# Patient Record
Sex: Male | Born: 1945 | Race: White | Hispanic: No | Marital: Married | Smoking: Never smoker
Health system: Southern US, Community
[De-identification: ages and names within clinical notes are randomized; demographics above are authoritative.]

## PROBLEM LIST (undated history)

## (undated) DIAGNOSIS — R351 Nocturia: Secondary | ICD-10-CM

## (undated) DIAGNOSIS — R3915 Urgency of urination: Secondary | ICD-10-CM

## (undated) DIAGNOSIS — N201 Calculus of ureter: Secondary | ICD-10-CM

## (undated) DIAGNOSIS — E7800 Pure hypercholesterolemia, unspecified: Secondary | ICD-10-CM

## (undated) DIAGNOSIS — K200 Eosinophilic esophagitis: Secondary | ICD-10-CM

## (undated) DIAGNOSIS — R319 Hematuria, unspecified: Secondary | ICD-10-CM

## (undated) DIAGNOSIS — M545 Low back pain, unspecified: Secondary | ICD-10-CM

## (undated) DIAGNOSIS — G4733 Obstructive sleep apnea (adult) (pediatric): Secondary | ICD-10-CM

## (undated) DIAGNOSIS — Z87442 Personal history of urinary calculi: Secondary | ICD-10-CM

## (undated) DIAGNOSIS — N200 Calculus of kidney: Secondary | ICD-10-CM

## (undated) DIAGNOSIS — G4730 Sleep apnea, unspecified: Secondary | ICD-10-CM

## (undated) DIAGNOSIS — R413 Other amnesia: Secondary | ICD-10-CM

## (undated) DIAGNOSIS — K219 Gastro-esophageal reflux disease without esophagitis: Secondary | ICD-10-CM

## (undated) DIAGNOSIS — E785 Hyperlipidemia, unspecified: Secondary | ICD-10-CM

## (undated) DIAGNOSIS — Z8719 Personal history of other diseases of the digestive system: Secondary | ICD-10-CM

## (undated) HISTORY — PX: EXTRACORPOREAL SHOCK WAVE LITHOTRIPSY: SHX1557

## (undated) HISTORY — DX: Low back pain, unspecified: M54.50

## (undated) HISTORY — PX: ESOPHAGOGASTRODUODENOSCOPY (EGD) WITH ESOPHAGEAL DILATION: SHX5812

## (undated) HISTORY — PX: CYSTOSCOPY WITH STENT PLACEMENT: SHX5790

## (undated) HISTORY — PX: FOOT SURGERY: SHX648

## (undated) HISTORY — PX: TONSILLECTOMY AND ADENOIDECTOMY: SUR1326

## (undated) HISTORY — DX: Hematuria, unspecified: R31.9

## (undated) HISTORY — PX: TONSILLECTOMY: SUR1361

## (undated) HISTORY — PX: HEMORRHOID SURGERY: SHX153

## (undated) HISTORY — PX: ADENOIDECTOMY: SUR15

## (undated) HISTORY — DX: Eosinophilic esophagitis: K20.0

## (undated) HISTORY — DX: Pure hypercholesterolemia, unspecified: E78.00

---
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
## 2008-07-31 HISTORY — PX: CATARACT EXTRACTION W/ INTRAOCULAR LENS  IMPLANT, BILATERAL: SHX1307

## 2010-12-30 HISTORY — PX: CYSTOSCOPY WITH STENT PLACEMENT: SHX5790

## 2011-02-17 ENCOUNTER — Ambulatory Visit (HOSPITAL_COMMUNITY)
Admission: RE | Admit: 2011-02-17 | Discharge: 2011-02-17 | Disposition: A | Discharge status: Home / Self Care | Payer: Medicare Other | Source: Ambulatory Visit | Attending: Urology | Admitting: Urology

## 2011-02-17 DIAGNOSIS — M79609 Pain in unspecified limb: Secondary | ICD-10-CM | POA: Insufficient documentation

## 2011-02-17 DIAGNOSIS — E785 Hyperlipidemia, unspecified: Secondary | ICD-10-CM | POA: Insufficient documentation

## 2011-02-27 ENCOUNTER — Ambulatory Visit (HOSPITAL_COMMUNITY)
Admission: RE | Admit: 2011-02-27 | Discharge: 2011-02-27 | Disposition: A | Discharge status: Home / Self Care | Payer: Medicare Other | Source: Ambulatory Visit | Attending: Urology | Admitting: Urology

## 2011-02-27 DIAGNOSIS — E669 Obesity, unspecified: Secondary | ICD-10-CM | POA: Insufficient documentation

## 2011-02-27 DIAGNOSIS — N201 Calculus of ureter: Secondary | ICD-10-CM | POA: Insufficient documentation

## 2011-02-27 DIAGNOSIS — E7800 Pure hypercholesterolemia, unspecified: Secondary | ICD-10-CM | POA: Insufficient documentation

## 2011-02-27 DIAGNOSIS — G4730 Sleep apnea, unspecified: Secondary | ICD-10-CM | POA: Insufficient documentation

## 2011-03-03 ENCOUNTER — Ambulatory Visit (HOSPITAL_BASED_OUTPATIENT_CLINIC_OR_DEPARTMENT_OTHER)
Admission: RE | Admit: 2011-03-03 | Discharge: 2011-03-03 | Disposition: A | Discharge status: Home / Self Care | Payer: Medicare Other | Source: Ambulatory Visit | Attending: Urology | Admitting: Urology

## 2011-03-03 DIAGNOSIS — Z01810 Encounter for preprocedural cardiovascular examination: Secondary | ICD-10-CM | POA: Insufficient documentation

## 2011-03-03 DIAGNOSIS — Z01812 Encounter for preprocedural laboratory examination: Secondary | ICD-10-CM | POA: Insufficient documentation

## 2011-03-03 DIAGNOSIS — Z466 Encounter for fitting and adjustment of urinary device: Secondary | ICD-10-CM | POA: Insufficient documentation

## 2011-03-03 DIAGNOSIS — G4733 Obstructive sleep apnea (adult) (pediatric): Secondary | ICD-10-CM | POA: Insufficient documentation

## 2011-03-17 NOTE — Op Note (Signed)
  NAMEELIGH, RYBACKI NO.:  [id number]  MEDICAL RECORD NO.:  [id number]  LOCATION:                                 FACILITY:  PHYSICIAN:  Danae Chen, M.D.  DATE OF BIRTH:  06-12-1946  DATE OF PROCEDURE:  03/03/2011 DATE OF DISCHARGE:                              OPERATIVE REPORT   PREOPERATIVE DIAGNOSES:  Status post extracorporeal shockwave lithotripsy left ureteral calculus and double-J stent.  POSTOPERATIVE DIAGNOSES:  Status post extracorporeal shockwave lithotripsy left ureteral calculus and double-J stent.  PROCEDURE:  Cystoscopy and removal of double-J stent.  SURGEON:  Danae Chen, MD  ANESTHESIA:  Monitored anesthesia care.  INDICATIONS:  The patient is a 65 year old male who had ESL of the left ureteral calculus on July 30th.  He had a double-J stent inserted at the [facility] in [place] on June 20th for severe left flank pain.  The patient could not tolerate cystoscopy in the office under local anesthesia.  He is scheduled today for removal of double-J stent under anesthesia.  The patient was identified by his wristband and proper time-out was taken.  Under monitored anesthesia care, he was prepped and draped and placed in the dorsal lithotomy position.  A panendoscope was inserted in the bladder.  The anterior urethra is normal.  He has moderate prostatic hypertrophy.  The bladder mucosa was reddened.  There was no stone or tumor in the bladder.  The distal end of the double-J stent was grasped with a grasping forceps and pulled out of the urethra.  The patient tolerated the procedure well and left the OR in satisfactory condition to post-anesthesia care unit.     Danae Chen, M.D.     MN/MEDQ  D:  03/03/2011  T:  03/04/2011  Job:  [id number]  Electronically Signed by Lindaann Slough M.D. on 03/17/2011 10:39:32 AM

## 2014-06-16 ENCOUNTER — Other Ambulatory Visit: Payer: Self-pay | Admitting: Urology

## 2014-06-19 ENCOUNTER — Encounter (HOSPITAL_COMMUNITY): Payer: Self-pay | Admitting: *Deleted

## 2014-06-29 ENCOUNTER — Ambulatory Visit (HOSPITAL_COMMUNITY)
Admission: RE | Admit: 2014-06-29 | Discharge: 2014-06-29 | Disposition: A | Discharge status: Home / Self Care | Payer: Medicare Other | Source: Ambulatory Visit | Attending: Urology | Admitting: Urology

## 2014-06-29 ENCOUNTER — Ambulatory Visit (HOSPITAL_COMMUNITY): Payer: Medicare Other

## 2014-06-29 ENCOUNTER — Encounter (HOSPITAL_COMMUNITY): Payer: Self-pay | Admitting: General Practice

## 2014-06-29 ENCOUNTER — Encounter (HOSPITAL_COMMUNITY)
Admission: RE | Disposition: A | Discharge status: Home / Self Care | Payer: Self-pay | Source: Ambulatory Visit | Attending: Urology

## 2014-06-29 DIAGNOSIS — Z87442 Personal history of urinary calculi: Secondary | ICD-10-CM | POA: Diagnosis not present

## 2014-06-29 DIAGNOSIS — Z8639 Personal history of other endocrine, nutritional and metabolic disease: Secondary | ICD-10-CM | POA: Insufficient documentation

## 2014-06-29 DIAGNOSIS — Z8709 Personal history of other diseases of the respiratory system: Secondary | ICD-10-CM | POA: Diagnosis not present

## 2014-06-29 DIAGNOSIS — Z8041 Family history of malignant neoplasm of ovary: Secondary | ICD-10-CM | POA: Insufficient documentation

## 2014-06-29 DIAGNOSIS — Z8042 Family history of malignant neoplasm of prostate: Secondary | ICD-10-CM | POA: Insufficient documentation

## 2014-06-29 DIAGNOSIS — N200 Calculus of kidney: Secondary | ICD-10-CM | POA: Insufficient documentation

## 2014-06-29 DIAGNOSIS — Z818 Family history of other mental and behavioral disorders: Secondary | ICD-10-CM | POA: Insufficient documentation

## 2014-06-29 DIAGNOSIS — Z833 Family history of diabetes mellitus: Secondary | ICD-10-CM | POA: Insufficient documentation

## 2014-06-29 HISTORY — DX: Sleep apnea, unspecified: G47.30

## 2014-06-29 HISTORY — DX: Personal history of urinary calculi: Z87.442

## 2014-06-29 SURGERY — LITHOTRIPSY, ESWL
Anesthesia: LOCAL | Laterality: Right

## 2014-06-29 MED ORDER — DIAZEPAM 5 MG PO TABS
10.0000 mg | ORAL_TABLET | ORAL | Status: AC
  Administered 2014-06-29: 10 mg via ORAL
  Filled 2014-06-29: qty 2

## 2014-06-29 MED ORDER — CIPROFLOXACIN HCL 500 MG PO TABS
500.0000 mg | ORAL_TABLET | ORAL | Status: AC
  Administered 2014-06-29: 500 mg via ORAL
  Filled 2014-06-29: qty 1

## 2014-06-29 MED ORDER — DEXTROSE-NACL 5-0.45 % IV SOLN
INTRAVENOUS | Status: DC
  Administered 2014-06-29: 11:00:00 via INTRAVENOUS

## 2014-06-29 MED ORDER — ONDANSETRON HCL 4 MG/2ML IJ SOLN
4.0000 mg | Freq: Once | INTRAMUSCULAR | Status: AC
  Administered 2014-06-29: 4 mg via INTRAVENOUS
  Filled 2014-06-29: qty 2

## 2014-06-29 MED ORDER — DIPHENHYDRAMINE HCL 25 MG PO CAPS
25.0000 mg | ORAL_CAPSULE | ORAL | Status: AC
  Administered 2014-06-29: 25 mg via ORAL
  Filled 2014-06-29: qty 1

## 2014-06-29 NOTE — H&P (Signed)
History of Present Illness    Mr Slinker returns for follow-up management of nephrolithiasis. He has 2 adjacent stones in the mid pole of the right kidney. The largest one measures 8 x 6 mm, the smaller one measures 5 x 4 mm. KUB shows no changes in the size or location of the stones. He does not have any flank pain. Urinalysis shows 0-2 RBCs.   Past Medical History Problems  1. History of hypercholesterolemia (Z86.39) 2. History of kidney stones (Z87.442) 3. History of sleep apnea (Z87.09)  Surgical History Problems  1. History of Cystoscopy With Insertion Of Ureteral Stent 2. History of Cystoscopy With Removal Of Object 3. History of Hemorrhoidectomy 4. History of Lithotripsy  Current Meds 1. Aspirin 81 MG Oral Tablet;  Therapy: (Recorded:19Jul2012) to Recorded 2. Gabapentin 300 MG Oral Capsule;  Therapy: (Recorded:14Oct2015) to Recorded 3. Ibuprofen TABS;  Therapy: (Recorded:14Oct2015) to Recorded 4. Multi-Vitamin TABS;  Therapy: (Recorded:19Jul2012) to Recorded 5. Tamsulosin HCl - 0.4 MG Oral Capsule;  Therapy: (Recorded:19Jul2012) to Recorded 6. TiZANidine HCl - 4 MG Oral Tablet;  Therapy: (Recorded:14Oct2015) to Recorded  Allergies Medication  1. No Known Drug Allergies  Family History Problems  1. Family history of Diabetes Mellitus : Father 2. Family history of Family Health Status Number Of Children   1 son/ 1 daughter 3. Family history of Father Deceased At Age 45   Prostate cancer 24. Family history of Mother Deceased At Age 41   Ovarian 5. Family history of Prostate Cancer  Social History Problems  1. Denied: History of Alcohol Use 2. Caffeine Use   3 per  wk 3. Marital History - Currently Married 4. Never A Smoker  Review of Systems Genitourinary, constitutional, skin, eye, otolaryngeal, hematologic/lymphatic, cardiovascular, pulmonary, endocrine, musculoskeletal, gastrointestinal, neurological and psychiatric system(s) were reviewed and pertinent  findings if present are noted and are otherwise negative.    Physical Exam Constitutional: Well nourished and well developed . No acute distress.  ENT:. The ears and nose are normal in appearance.  Neck: The appearance of the neck is normal and no neck mass is present.  Pulmonary: No respiratory distress and normal respiratory rhythm and effort.  Cardiovascular: Heart rate and rhythm are normal . No peripheral edema.  Abdomen: The abdomen is soft and nontender. No masses are palpated. No CVA tenderness. No hernias are palpable. No hepatosplenomegaly noted.  Genitourinary: Examination of the penis demonstrates no discharge, no masses, no lesions and a normal meatus. The scrotum is without lesions. The right epididymis is palpably normal and non-tender. The left epididymis is palpably normal and non-tender. The right testis is non-tender and without masses. The left testis is non-tender and without masses.  Lymphatics: The femoral and inguinal nodes are not enlarged or tender.  Skin: Normal skin turgor, no visible rash and no visible skin lesions.  Neuro/Psych:. Mood and affect are appropriate.    Results/Data Urine [Data Includes: Last 1 Day]   [id number]  COLOR AMBER   APPEARANCE CLEAR   SPECIFIC GRAVITY 1.030   pH 5.0   GLUCOSE NEG mg/dL  BILIRUBIN SMALL   KETONE NEG mg/dL  BLOOD SMALL   PROTEIN NEG mg/dL  UROBILINOGEN 0.2 mg/dL  NITRITE NEG   LEUKOCYTE ESTERASE NEG   SQUAMOUS EPITHELIAL/HPF RARE   WBC 3-6 WBC/hpf  RBC 0-2 RBC/hpf  BACTERIA FEW   CRYSTALS NONE SEEN   CASTS NONE SEEN   Other SEE NOTE    Assessment Assessed  1. Calculus of right kidney (N20.0)  Plan Calculus  of right kidney  1. Follow-up Schedule Surgery Office  Follow-up  Status: Complete  Done: [id number] Health Maintenance  2. UA With REFLEX; [Do Not Release]; Status:Complete;   Done: [id number] 10:39AM  Urinalysis today shows 0-2 RBCs. Will not get CT scan. I have discussed management of the renal  calculi with him. I told him that I think ESL is the least invasive of the procedures. He had ESL of a left ureteral stone in the past. He wishes to proceed with ESL.

## 2014-06-29 NOTE — Op Note (Signed)
Refer to [facility] Op Note scanned in the chart 

## 2014-06-29 NOTE — Discharge Instructions (Signed)
See [employer] Discharge papers.

## 2014-07-23 NOTE — Op Note (Signed)
Refer to [facility] Op Note scanned in the chart 

## 2015-09-29 DIAGNOSIS — K200 Eosinophilic esophagitis: Secondary | ICD-10-CM

## 2015-09-29 HISTORY — DX: Eosinophilic esophagitis: K20.0

## 2015-11-04 ENCOUNTER — Encounter: Payer: Self-pay | Admitting: Allergy and Immunology

## 2015-11-04 ENCOUNTER — Ambulatory Visit (INDEPENDENT_AMBULATORY_CARE_PROVIDER_SITE_OTHER): Payer: Medicare Other | Admitting: Allergy and Immunology

## 2015-11-04 VITALS — BP 130/82 | HR 76 | Temp 98.5°F | Resp 18 | Ht 62.99 in | Wt 218.7 lb

## 2015-11-04 DIAGNOSIS — Z91018 Allergy to other foods: Secondary | ICD-10-CM

## 2015-11-04 DIAGNOSIS — K200 Eosinophilic esophagitis: Secondary | ICD-10-CM | POA: Diagnosis not present

## 2015-11-04 MED ORDER — FLUTICASONE PROPIONATE HFA 220 MCG/ACT IN AERO: INHALATION_SPRAY | RESPIRATORY_TRACT | Status: DC

## 2015-11-04 NOTE — Progress Notes (Signed)
Dear Dr Lin Landsman,  Thank you for referring Robert Oconnell. to the [facility] on 11/04/2015.   Below is a summation of this patient's evaluation and recommendations.  Thank you for your referral. I will keep you informed about this patient's response to treatment.   If you have any questions please to do hestitate to contact me.   Sincerely,  Jiles Prows, MD [place]   ______________________________________________________________________    NEW PATIENT NOTE  Referring Provider: Angelina Sheriff, MD Primary Provider: Angelina Sheriff., MD Date of office visit: 11/04/2015    Subjective:   Chief Complaint:  Robert Oconnell. (DOB: October 30, 1945) is a 70 y.o. 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 He has a several year history of progressive problems with swallowing dysfunction and frank obstruction for which she recently visited with Dr. Lyndel Safe the last month who performed an upper endoscopy and diagnosed eosinophilic esophagitis for which she was treated with a esophageal dilation, omeprazole, and started on swallowed Flovent. Immediately after his dilatation he did much better regarding his swallowing.   Other than some mild allergic rhinoconjunctivitis during the spring time which is a minimal issue he has no other associated atopic disease.   Past Medical History  Diagnosis Date  . Sleep apnea     CPAP used until he lost weight. No longer needs machine  . History of kidney stones   . High cholesterol     Past Surgical History  Procedure Laterality Date  . Cystoscopy with stent placement  past 10 years  . Tonsillectomy      age 43  . Foot surgery Right   . Hemorrhoid surgery      past 10 years  . Adenoidectomy         Medication List           aspirin EC 81 MG tablet  Take 81 mg by mouth daily.     fluticasone 110 MCG/ACT inhaler  Commonly known as:  FLOVENT HFA  Inhale 1 puff into the lungs 2 (two) times daily.     omeprazole 20 MG capsule  Commonly known as:  PRILOSEC  Take 20 mg by mouth daily.     simvastatin 40 MG tablet  Commonly known as:  ZOCOR  Take 40 mg by mouth at bedtime.     tamsulosin 0.4 MG Caps capsule  Commonly known as:  FLOMAX  Take 0.4 mg by mouth daily.        Allergies  Allergen Reactions  . Nabumetone     Blistered skin  . Tizanidine Other (See Comments)    Hullcinations, agitation    Review of systems negative except as noted in HPI / PMHx or noted below:  Review of Systems  Constitutional: Negative.   HENT: Negative.   Eyes: Negative.   Respiratory: Negative.   Cardiovascular: Negative.   Gastrointestinal: Negative.   Genitourinary: Negative.   Musculoskeletal: Negative.   Skin: Negative.   Neurological: Negative.   Endo/Heme/Allergies: Negative.   Psychiatric/Behavioral: Negative.     Family History  Problem Relation Age of Onset  . Cancer Mother   . Cancer Father     Social History   Social History  . Marital Status: Married    Spouse  Name: N/A  . Number of Children: N/A  . Years of Education: N/A   Occupational History  . Not on file.   Social History Main Topics  . Smoking status: Never Smoker   . Smokeless tobacco: Not on file  . Alcohol Use: No  . Drug Use: No  . Sexual Activity: Not on file   Other Topics Concern  . Not on file   Social History Narrative    Environmental and Social history  Lives in a house with a dry environment, no animals inside the household, carpeting in the bedroom, plastic on the bed and pillow, no smokers in the household, and he is a retired Metallurgist.   Objective:   Filed Vitals:   11/04/15 1344  BP: 130/82  Pulse: 76  Temp: 98.5 F (36.9 C)  Resp: 18    Height: 5' 2.99" (160 cm) Weight: 218 lb 11.1 oz (99.2 kg)  Physical Exam  Constitutional: He is well-developed, well-nourished, and in no distress.  HENT:  Head: Normocephalic. Head is without right periorbital erythema and without left periorbital erythema.  Right Ear: Tympanic membrane, external ear and ear canal normal.  Left Ear: Tympanic membrane, external ear and ear canal normal.  Nose: Nose normal. No mucosal edema or rhinorrhea.  Mouth/Throat: Uvula is midline, oropharynx is clear and moist and mucous membranes are normal. No oropharyngeal exudate.  Eyes: Conjunctivae and lids are normal. Pupils are equal, round, and reactive to light.  Neck: Trachea normal. No tracheal tenderness present. No tracheal deviation present. No thyromegaly present.  Cardiovascular: Normal rate, regular rhythm, S1 normal, S2 normal and normal heart sounds.   No murmur heard. Pulmonary/Chest: Effort normal and breath sounds normal. No stridor. No tachypnea. No respiratory distress. He has no wheezes. He has no rales. He exhibits no tenderness.  Abdominal: Soft. He exhibits no distension and no mass. There is no hepatosplenomegaly. There is no tenderness. There is no rebound and no guarding.  Musculoskeletal: He exhibits no edema or tenderness.  Lymphadenopathy:       Head (right side): No tonsillar adenopathy present.       Head (left side): No tonsillar adenopathy present.    He has no cervical adenopathy.    He has no axillary adenopathy.  Neurological: He is alert. Gait normal.  Skin: No rash noted. He is not diaphoretic. No erythema. No pallor. Nails show no clubbing.  Psychiatric: Mood and affect normal.     Diagnostics: Allergy skin tests were performed.   Assessment and Plan:    1. Eosinophilic esophagitis   2. Food allergy     1. Allergen avoidance measures  2. Possible for food elimination diet?  3. Possible dairy elimination diet?  4. Continue Flovent 220 2 puff and  swallow twice a day  5. Continue omeprazole 20 mg one time per day  6. Return to clinic in 12 weeks or earlier if problem  Robert Oconnell should do well with therapy mentioned above. I had a talk with him today about for food elimination diet including dairy, egg, wheat, and Lodine versus just dairy avoidance measures versus just using Flovent. At this point we will have him continue to use Flovent on a regular basis and regroup with him in 12 weeks and make a decision about how to proceed pending his response.  Jiles Prows, MD [facility]

## 2015-11-04 NOTE — Patient Instructions (Signed)
  1. Allergen avoidance measures  2. Possible for food elimination diet?  3. Possible dairy elimination diet?  4. Continue Flovent 220 2 puff and swallow twice a day  5. Continue omeprazole 20 mg one time per day  6. Return to clinic in 12 weeks or earlier if problem

## 2015-11-08 ENCOUNTER — Encounter: Payer: Self-pay | Admitting: *Deleted

## 2016-01-27 ENCOUNTER — Encounter: Payer: Self-pay | Admitting: Allergy and Immunology

## 2016-01-27 ENCOUNTER — Ambulatory Visit (INDEPENDENT_AMBULATORY_CARE_PROVIDER_SITE_OTHER): Payer: Medicare Other | Admitting: Allergy and Immunology

## 2016-01-27 VITALS — BP 128/76 | HR 80 | Resp 20

## 2016-01-27 DIAGNOSIS — K200 Eosinophilic esophagitis: Secondary | ICD-10-CM

## 2016-01-27 NOTE — Progress Notes (Signed)
Follow-up Note  Referring Provider: Angelina Sheriff, MD Primary Provider: Angelina Sheriff., MD Date of Office Visit: 01/27/2016  Subjective:   Robert Oconnell. (DOB: 1945-08-25) is a 70 y.o. male who returns to the [facility] on 01/27/2016 in re-evaluation of the following:  HPI: Robert Oconnell returns to this clinic in reevaluation of his eosinophilic esophagitis. While using medical therapy established 11/04/2015 he has had complete resolution of all problems with swallowing. He continues to use his Flovent and proton pump inhibitor consistently. He has been performing avoidance measures regarding dairy consumption and he is close to 100% of avoidance although certainly he does Cheat on occasion.     Medication List           aspirin EC 81 MG tablet  Take 81 mg by mouth daily.     fluticasone 220 MCG/ACT inhaler  Commonly known as:  FLOVENT HFA  Take 2 puffs twice daily. Spray and swallow. Do not inhale.     omeprazole 20 MG capsule  Commonly known as:  PRILOSEC  Take 20 mg by mouth daily.     simvastatin 40 MG tablet  Commonly known as:  ZOCOR  Take 40 mg by mouth at bedtime.     tamsulosin 0.4 MG Caps capsule  Commonly known as:  FLOMAX  Take 0.4 mg by mouth daily.        Past Medical History  Diagnosis Date  . Sleep apnea     CPAP used until he lost weight. No longer needs machine  . History of kidney stones   . High cholesterol     Past Surgical History  Procedure Laterality Date  . Cystoscopy with stent placement  past 10 years  . Tonsillectomy      age 89  . Foot surgery Right   . Hemorrhoid surgery      past 10 years  . Adenoidectomy      Allergies  Allergen Reactions  . Nabumetone     Blistered skin  . Tizanidine Other (See Comments)    Hullcinations, agitation    Review of systems negative except as noted in HPI / PMHx or noted below:  Review of Systems  Constitutional: Negative.   HENT: Negative.   Eyes: Negative.     Respiratory: Negative.   Cardiovascular: Negative.   Gastrointestinal: Negative.   Genitourinary: Negative.   Musculoskeletal: Negative.   Skin: Negative.   Neurological: Negative.   Endo/Heme/Allergies: Negative.   Psychiatric/Behavioral: Negative.      Objective:   Filed Vitals:   01/27/16 1116  BP: 128/76  Pulse: 80  Resp: 20          Physical Exam  Constitutional: He is well-developed, well-nourished, and in no distress.  HENT:  Head: Normocephalic.  Right Ear: Tympanic membrane, external ear and ear canal normal.  Left Ear: Tympanic membrane, external ear and ear canal normal.  Nose: Nose normal. No mucosal edema or rhinorrhea.  Mouth/Throat: Uvula is midline, oropharynx is clear and moist and mucous membranes are normal. No oropharyngeal exudate.  Eyes: Conjunctivae are normal.  Neck: Trachea normal. No tracheal tenderness present. No tracheal deviation present. No thyromegaly present.  Cardiovascular: Normal rate, regular rhythm, S1 normal, S2 normal and normal heart sounds.   No murmur heard. Pulmonary/Chest: Breath sounds normal. No stridor. No respiratory distress. He has no wheezes. He has no rales.  Musculoskeletal: He exhibits no edema.  Lymphadenopathy:       Head (right  side): No tonsillar adenopathy present.       Head (left side): No tonsillar adenopathy present.    He has no cervical adenopathy.  Neurological: He is alert. Gait normal.  Skin: No rash noted. He is not diaphoretic. No erythema. Nails show no clubbing.  Psychiatric: Mood and affect normal.    Diagnostics: None    Assessment and Plan:   1. Eosinophilic esophagitis     1. Allergen avoidance measures  2. Attempt to continue dairy avoidance  3. Decrease Flovent 220 1 puff and swallow twice a day  4. Continue omeprazole 20 mg one time per day  5. Return to clinic in 12 weeks or earlier if problem  Robert Oconnell has done very well on his current medical therapy and we'll now get him  to decrease his dose of Flovent by 50% and see what happens over the course of the next 12 weeks. If he continues to do well we'll see we can further lower his dose of Flovent at that point in time. He'll contact me should he develop significant problems during the interval.  [name], MD [facility]

## 2016-01-27 NOTE — Patient Instructions (Signed)
  1. Allergen avoidance measures  2. Attempt to continue dairy avoidance  3. Decrease Flovent 220 1 puff and swallow twice a day  4. Continue omeprazole 20 mg one time per day  5. Return to clinic in 12 weeks or earlier if problem

## 2016-05-01 ENCOUNTER — Encounter: Payer: Self-pay | Admitting: Allergy and Immunology

## 2016-05-01 ENCOUNTER — Ambulatory Visit (INDEPENDENT_AMBULATORY_CARE_PROVIDER_SITE_OTHER): Payer: Medicare Other | Admitting: Allergy and Immunology

## 2016-05-01 VITALS — BP 120/80 | HR 76 | Resp 20

## 2016-05-01 DIAGNOSIS — K200 Eosinophilic esophagitis: Secondary | ICD-10-CM | POA: Diagnosis not present

## 2016-05-01 NOTE — Patient Instructions (Addendum)
  1. Allergen avoidance measures  2. Continue dairy avoidance  3. Decrease Flovent 220 1 puff and swallow once a day  4. Continue omeprazole 20 mg one time per day  5. Return to clinic in 12 weeks or earlier if problem  6. Obtain fall flu vaccine

## 2016-05-01 NOTE — Progress Notes (Signed)
Follow-up Note  Referring Provider: Angelina Sheriff, MD Primary Provider: Angelina Sheriff., MD Date of Office Visit: 05/01/2016  Subjective:   [name]. (DOB: Mar 07, 1946) is a 70 y.o. male who returns to the [facility] on 05/01/2016 in re-evaluation of the following:  HPI: Robert Oconnell returns to this clinic in reevaluation of his Eosinophilic esophagitis treated with dairy avoidance, proton pump inhibitor, and swallowed Flovent. I last saw him in his clinic in June 2017.  He has continued to do very well regarding all the swallowing issues. He has no heartburn or indigestion. He completely avoids all dairy consumption and is drinking almond milk.    Medication List      aspirin EC 81 MG tablet Take 81 mg by mouth daily.   fluticasone 220 MCG/ACT inhaler Commonly known as:  FLOVENT HFA Take 2 puffs twice daily. Spray and swallow. Do not inhale.   omeprazole 20 MG capsule Commonly known as:  PRILOSEC Take 20 mg by mouth daily.   simvastatin 40 MG tablet Commonly known as:  ZOCOR Take 40 mg by mouth at bedtime.   tamsulosin 0.4 MG Caps capsule Commonly known as:  FLOMAX Take 0.4 mg by mouth daily.       Past Medical History:  Diagnosis Date  . Eosinophilic esophagitis   . High cholesterol   . History of kidney stones   . Sleep apnea    CPAP used until he lost weight. No longer needs machine    Past Surgical History:  Procedure Laterality Date  . ADENOIDECTOMY    . CYSTOSCOPY WITH STENT PLACEMENT  past 10 years  . FOOT SURGERY Right   . HEMORRHOID SURGERY     past 10 years  . TONSILLECTOMY     age 58    Allergies  Allergen Reactions  . Nabumetone     Blistered skin  . Tizanidine Other (See Comments)    Hullcinations, agitation    Review of systems negative except as noted in HPI / PMHx or noted below:  Review of Systems  Constitutional: Negative.   HENT: Negative.   Eyes: Negative.   Respiratory: Negative.     Cardiovascular: Negative.   Gastrointestinal: Negative.   Genitourinary: Negative.   Musculoskeletal: Negative.   Skin: Negative.   Neurological: Negative.   Endo/Heme/Allergies: Negative.   Psychiatric/Behavioral: Negative.      Objective:   Vitals:   05/01/16 1124  BP: 120/80  Pulse: 76  Resp: 20          Physical Exam  Constitutional: He is well-developed, well-nourished, and in no distress.  HENT:  Head: Normocephalic.  Right Ear: Tympanic membrane, external ear and ear canal normal.  Left Ear: Tympanic membrane, external ear and ear canal normal.  Nose: Nose normal. No mucosal edema or rhinorrhea.  Mouth/Throat: Uvula is midline, oropharynx is clear and moist and mucous membranes are normal. No oropharyngeal exudate.  Eyes: Conjunctivae are normal.  Neck: Trachea normal. No tracheal tenderness present. No tracheal deviation present. No thyromegaly present.  Cardiovascular: Normal rate, regular rhythm, S1 normal, S2 normal and normal heart sounds.   No murmur heard. Pulmonary/Chest: Breath sounds normal. No stridor. No respiratory distress. He has no wheezes. He has no rales.  Musculoskeletal: He exhibits no edema.  Lymphadenopathy:       Head (right side): No tonsillar adenopathy present.       Head (left side): No tonsillar adenopathy present.    He has no  cervical adenopathy.  Neurological: He is alert. Gait normal.  Skin: No rash noted. He is not diaphoretic. No erythema. Nails show no clubbing.  Psychiatric: Mood and affect normal.    Diagnostics: None   Assessment and Plan:   1. Eosinophilic esophagitis     1. Allergen avoidance measures  2. Continue dairy avoidance  3. Decrease Flovent 220 1 puff and swallow once a day  4. Continue omeprazole 20 mg one time per day  5. Return to clinic in 12 weeks or earlier if problem  6. Obtain fall flu vaccine  Robert Oconnell is doing quite well at this point in time and we'll see if we can further lower his dose  of swallowed steroids as noted above. When I see him back in this clinic in 3 months there may be an opportunity to completely have him come off all swallowed steroid and see what happens just with a combination of a proton pump inhibitor and dairy avoidance.  [name], MD [facility]

## 2016-08-07 ENCOUNTER — Encounter: Payer: Self-pay | Admitting: Allergy and Immunology

## 2016-08-07 ENCOUNTER — Ambulatory Visit (INDEPENDENT_AMBULATORY_CARE_PROVIDER_SITE_OTHER): Payer: Medicare Other | Admitting: Allergy and Immunology

## 2016-08-07 VITALS — BP 128/84 | HR 72 | Resp 20

## 2016-08-07 DIAGNOSIS — K200 Eosinophilic esophagitis: Secondary | ICD-10-CM | POA: Diagnosis not present

## 2016-08-07 DIAGNOSIS — K2100 Gastro-esophageal reflux disease with esophagitis, without bleeding: Secondary | ICD-10-CM

## 2016-08-07 NOTE — Progress Notes (Signed)
Follow-up Note  Referring Provider: Angelina Sheriff, MD Primary Provider: Angelina Sheriff., MD Date of Office Visit: 08/07/2016  Subjective:   Robert Oconnell. (DOB: 1945/09/07) is a 71 y.o. male who returns to the [facility] on 08/07/2016 in re-evaluation of the following:  HPI: Robert Oconnell returns to this clinic in reevaluation of his eosinophilic esophagitis presently treated with dairy avoidance and a proton pump inhibitor and swallowed Flovent. I last saw him in this clinic in October 2017 at which time we decreased his dose of swallowed steroid.  He has continued to do very well. He has no issues with heartburn or indigestion. He has no issues with obstructive swallowing. He continues to remain away from all dairy and he continues to use his omeprazole and Flovent consistently.  He did receive the flu vaccine this year.  Allergies as of 08/07/2016      Reactions   Nabumetone    Blistered skin   Tizanidine Other (See Comments)   Hullcinations, agitation      Medication List      aspirin EC 81 MG tablet Take 81 mg by mouth daily.   Cyanocobalamin 1000 MCG/ML Kit Inject as directed.   fluticasone 220 MCG/ACT inhaler Commonly known as:  FLOVENT HFA Take 2 puffs twice daily. Spray and swallow. Do not inhale.   omeprazole 20 MG capsule Commonly known as:  PRILOSEC Take 20 mg by mouth daily.   simvastatin 40 MG tablet Commonly known as:  ZOCOR Take 40 mg by mouth at bedtime.   tamsulosin 0.4 MG Caps capsule Commonly known as:  FLOMAX Take 0.4 mg by mouth daily.       Past Medical History:  Diagnosis Date  . Eosinophilic esophagitis   . High cholesterol   . History of kidney stones   . Sleep apnea    CPAP used until he lost weight. No longer needs machine    Past Surgical History:  Procedure Laterality Date  . ADENOIDECTOMY    . CYSTOSCOPY WITH STENT PLACEMENT  past 10 years  . FOOT SURGERY Right   . HEMORRHOID SURGERY     past 10  years  . TONSILLECTOMY     age 3    Review of systems negative except as noted in HPI / PMHx or noted below:  Review of Systems  Constitutional: Negative.   HENT: Negative.   Eyes: Negative.   Respiratory: Negative.   Cardiovascular: Negative.   Gastrointestinal: Negative.   Genitourinary: Negative.   Musculoskeletal: Negative.   Skin: Negative.   Neurological: Negative.   Endo/Heme/Allergies: Negative.   Psychiatric/Behavioral: Negative.      Objective:   Vitals:   08/07/16 1050  BP: 128/84  Pulse: 72  Resp: 20          Physical Exam  Constitutional: He is well-developed, well-nourished, and in no distress.  HENT:  Head: Normocephalic.  Right Ear: Tympanic membrane, external ear and ear canal normal.  Left Ear: Tympanic membrane, external ear and ear canal normal.  Nose: Nose normal. No mucosal edema or rhinorrhea.  Mouth/Throat: Uvula is midline, oropharynx is clear and moist and mucous membranes are normal. No oropharyngeal exudate.  Eyes: Conjunctivae are normal.  Neck: Trachea normal. No tracheal tenderness present. No tracheal deviation present. No thyromegaly present.  Cardiovascular: Normal rate, regular rhythm, S1 normal, S2 normal and normal heart sounds.   No murmur heard. Pulmonary/Chest: Breath sounds normal. No stridor. No respiratory distress. He has no  wheezes. He has no rales.  Musculoskeletal: He exhibits no edema.  Lymphadenopathy:       Head (right side): No tonsillar adenopathy present.       Head (left side): No tonsillar adenopathy present.    He has no cervical adenopathy.  Neurological: He is alert. Gait normal.  Skin: No rash noted. He is not diaphoretic. No erythema. Nails show no clubbing.  Psychiatric: Mood and affect normal.    Diagnostics: none   Assessment and Plan:   1. Eosinophilic esophagitis   2. Gastroesophageal reflux disease with esophagitis     1. Allergen avoidance measures  2. Continue dairy avoidance  3.  Discontinue Flovent  4. Continue omeprazole 20 mg one time per day  5. Return to clinic in June 2018 or earlier if problem   We will now see if Robert Oconnell does well with elimination of his swallowed steroid. He will continue to perform dairy avoidance and continue on a proton pump inhibitor. I will see him back in this clinic in 6 months or earlier if there is a problem.  [name], MD [place]

## 2016-08-07 NOTE — Patient Instructions (Addendum)
  1. Allergen avoidance measures  2. Continue dairy avoidance  3. Discontinue Flovent  4. Continue omeprazole 20 mg one time per day  5. Return to clinic in June 2018 or earlier if problem

## 2016-09-15 DIAGNOSIS — D696 Thrombocytopenia, unspecified: Secondary | ICD-10-CM | POA: Diagnosis not present

## 2016-09-15 DIAGNOSIS — E039 Hypothyroidism, unspecified: Secondary | ICD-10-CM | POA: Diagnosis not present

## 2016-09-23 ENCOUNTER — Encounter: Payer: Self-pay | Admitting: Genetic Counselor

## 2016-09-25 ENCOUNTER — Telehealth: Payer: Self-pay | Admitting: Genetic Counselor

## 2016-09-25 NOTE — Telephone Encounter (Signed)
Per Charlie Pitter, pt's wife cld to cancel genetic counseling appt.

## 2016-10-09 ENCOUNTER — Other Ambulatory Visit: Payer: Medicare Other

## 2017-01-08 ENCOUNTER — Encounter: Payer: Self-pay | Admitting: Allergy and Immunology

## 2017-01-08 ENCOUNTER — Ambulatory Visit (INDEPENDENT_AMBULATORY_CARE_PROVIDER_SITE_OTHER): Payer: Medicare Other | Admitting: Allergy and Immunology

## 2017-01-08 VITALS — BP 130/82 | HR 70 | Resp 14 | Ht 66.0 in | Wt 222.0 lb

## 2017-01-08 DIAGNOSIS — K200 Eosinophilic esophagitis: Secondary | ICD-10-CM | POA: Diagnosis not present

## 2017-01-08 DIAGNOSIS — K2100 Gastro-esophageal reflux disease with esophagitis, without bleeding: Secondary | ICD-10-CM

## 2017-01-08 NOTE — Patient Instructions (Addendum)
  1. Allergen avoidance measures  2. Continue dairy avoidance  3. Continue omeprazole 20 mg one time per day  4. Return to clinic in 12 months or earlier if problem

## 2017-01-08 NOTE — Progress Notes (Signed)
Follow-up Note  Referring Provider: Angelina Sheriff, MD Primary Provider: Angelina Sheriff, MD Date of Office Visit: 01/08/2017  Subjective:   Robert Oconnell. (DOB: [birth year]) is a 71 y.o. male who returns to the [facility] on 01/08/2017 in re-evaluation of the following:  HPI: Robert Oconnell returns to this clinic in evaluation of eosinophilic esophagitis with dairy trigger. His last visit to this clinic was January 2018.  He's been doing great while avoiding dairy. He discontinued his Flovent 6 months ago and has not had return of any obstructive issues. He can eat without any problem. He rarely has any heartburn or indigestion while continuing on omeprazole.  Allergies as of 01/08/2017      Reactions   Nabumetone    Blistered skin   Tizanidine Other (See Comments)   Hullcinations, agitation      Medication List      aspirin EC 81 MG tablet Take 81 mg by mouth daily.   Cyanocobalamin 1000 MCG/ML Kit Inject as directed.   fluticasone 220 MCG/ACT inhaler Commonly known as:  FLOVENT HFA Take 2 puffs twice daily. Spray and swallow. Do not inhale.   omeprazole 20 MG capsule Commonly known as:  PRILOSEC Take 20 mg by mouth daily.   simvastatin 40 MG tablet Commonly known as:  ZOCOR Take 40 mg by mouth at bedtime.   tamsulosin 0.4 MG Caps capsule Commonly known as:  FLOMAX Take 0.4 mg by mouth daily.       Past Medical History:  Diagnosis Date  . Eosinophilic esophagitis   . High cholesterol   . History of kidney stones   . Sleep apnea    CPAP used until he lost weight. No longer needs machine    Past Surgical History:  Procedure Laterality Date  . ADENOIDECTOMY    . CYSTOSCOPY WITH STENT PLACEMENT  past 10 years  . FOOT SURGERY Right   . HEMORRHOID SURGERY     past 10 years  . TONSILLECTOMY     age 68    Review of systems negative except as noted in HPI / PMHx or noted below:  Review of Systems  Constitutional: Negative.   HENT:  Negative.   Eyes: Negative.   Respiratory: Negative.   Cardiovascular: Negative.   Gastrointestinal: Negative.   Genitourinary: Negative.   Musculoskeletal: Negative.   Skin: Negative.   Neurological: Negative.   Endo/Heme/Allergies: Negative.   Psychiatric/Behavioral: Negative.      Objective:   Vitals:   01/08/17 1007  BP: 130/82  Pulse: 70  Resp: 14   Height: '5\' 6"'  (167.6 cm)  Weight: 222 lb (100.7 kg)   Physical Exam  Constitutional: He is well-developed, well-nourished, and in no distress.  HENT:  Head: Normocephalic.  Right Ear: Tympanic membrane, external ear and ear canal normal.  Left Ear: Tympanic membrane, external ear and ear canal normal.  Nose: Nose normal. No mucosal edema or rhinorrhea.  Mouth/Throat: Uvula is midline, oropharynx is clear and moist and mucous membranes are normal. No oropharyngeal exudate.  Eyes: Conjunctivae are normal.  Neck: Trachea normal. No tracheal tenderness present. No tracheal deviation present. No thyromegaly present.  Cardiovascular: Normal rate, regular rhythm, S1 normal, S2 normal and normal heart sounds.   No murmur heard. Pulmonary/Chest: Breath sounds normal. No stridor. No respiratory distress. He has no wheezes. He has no rales.  Musculoskeletal: He exhibits no edema.  Lymphadenopathy:       Head (right side): No tonsillar  adenopathy present.       Head (left side): No tonsillar adenopathy present.    He has no cervical adenopathy.  Neurological: He is alert. Gait normal.  Skin: No rash noted. He is not diaphoretic. No erythema. Nails show no clubbing.  Psychiatric: Mood and affect normal.    Diagnostics: none   Assessment and Plan:   1. Eosinophilic esophagitis   2. Gastroesophageal reflux disease with esophagitis     1. Allergen avoidance measures  2. Continue dairy avoidance  3. Continue omeprazole 20 mg one time per day  4. Return to clinic in 12 months or earlier if problem   Robert Oconnell has done very  well while performing dairy avoidance regarding his eosinophilic esophagitis and at this point it does not look like he is going to redevelop this issue with his current dietary manipulation. I will see him back in this clinic in 12 months or earlier if there is a problem.  [name], MD Allergy / Immunology [place]

## 2017-04-24 ENCOUNTER — Ambulatory Visit (INDEPENDENT_AMBULATORY_CARE_PROVIDER_SITE_OTHER): Payer: Medicare Other

## 2017-04-24 ENCOUNTER — Ambulatory Visit (INDEPENDENT_AMBULATORY_CARE_PROVIDER_SITE_OTHER): Payer: Medicare Other | Admitting: Podiatry

## 2017-04-24 ENCOUNTER — Encounter: Payer: Self-pay | Admitting: Podiatry

## 2017-04-24 ENCOUNTER — Encounter (INDEPENDENT_AMBULATORY_CARE_PROVIDER_SITE_OTHER): Payer: Self-pay

## 2017-04-24 VITALS — BP 114/73 | HR 116

## 2017-04-24 DIAGNOSIS — M216X2 Other acquired deformities of left foot: Secondary | ICD-10-CM

## 2017-04-24 DIAGNOSIS — M722 Plantar fascial fibromatosis: Secondary | ICD-10-CM

## 2017-04-24 MED ORDER — DEXAMETHASONE SODIUM PHOSPHATE 120 MG/30ML IJ SOLN
4.0000 mg | Freq: Once | INTRAMUSCULAR | Status: AC
  Administered 2017-04-24: 4 mg via INTRA_ARTICULAR

## 2017-04-24 MED ORDER — TRIAMCINOLONE ACETONIDE 10 MG/ML IJ SUSP
5.0000 mg | Freq: Once | INTRAMUSCULAR | Status: AC
  Administered 2017-04-24: 5 mg

## 2017-04-24 MED ORDER — MELOXICAM 7.5 MG PO TABS: 7.5000 mg | ORAL_TABLET | Freq: Every day | ORAL | 0 refills | Status: DC

## 2017-04-24 NOTE — Progress Notes (Addendum)
  Subjective:  Patient ID: Robert Oconnell., male    DOB: 11/09/1945,  MRN: [id number] HPI Chief Complaint  Patient presents with  . Foot Pain    left heel pain     71 y.o. male presents with the above complaint. Reports heel pain x2 weeks duration. Unsure what started it. Pain worst in AM and with periods of inactivity. Pain sharp in nature.  Past Medical History:  Diagnosis Date  . Eosinophilic esophagitis   . High cholesterol   . History of kidney stones   . Sleep apnea    CPAP used until he lost weight. No longer needs machine   Past Surgical History:  Procedure Laterality Date  . ADENOIDECTOMY    . CYSTOSCOPY WITH STENT PLACEMENT  past 10 years  . FOOT SURGERY Right   . HEMORRHOID SURGERY     past 10 years  . TONSILLECTOMY     age 78    Current Outpatient Prescriptions:  .  aspirin EC 81 MG tablet, Take 81 mg by mouth daily., Disp: , Rfl:  .  omeprazole (PRILOSEC) 20 MG capsule, Take 20 mg by mouth daily., Disp: , Rfl:  .  simvastatin (ZOCOR) 40 MG tablet, Take 40 mg by mouth at bedtime., Disp: , Rfl:  .  tamsulosin (FLOMAX) 0.4 MG CAPS capsule, Take 0.4 mg by mouth daily., Disp: , Rfl:  .  Cyanocobalamin 1000 MCG/ML KIT, Inject as directed., Disp: , Rfl:  .  fluticasone (FLOVENT HFA) 220 MCG/ACT inhaler, Take 2 puffs twice daily. Spray and swallow. Do not inhale. (Patient not taking: Reported on 01/08/2017), Disp: 1 Inhaler, Rfl: 3 .  meloxicam (MOBIC) 7.5 MG tablet, Take 1 tablet (7.5 mg total) by mouth daily., Disp: 30 tablet, Rfl: 0  Allergies  Allergen Reactions  . Nabumetone     Blistered skin  . Tizanidine Other (See Comments)    Hallcinations, agitation   Review of Systems negative except as noted in the HPI.  Objective:   Vitals:   04/24/17 1423  BP: 114/73  Pulse: (!) 116   General AA&O x3. Normal mood and affect.  Vascular Dorsalis pedis and posterior tibial pulses  present 2+ bilaterally  Capillary refill normal to all digits. Pedal hair growth  normal.  Neurologic Epicritic sensation grossly present bilaterally.  Dermatologic No open lesions. Interspaces clear of maceration. Nails well groomed and normal in appearance.  Orthopedic: MMT 5/5 in dorsiflexion, plantarflexion, inversion, and eversion left. Tender to palpation at the calcaneal tuber left. No pain with calcaneal squeeze left. Ankle ROM diminished range of motion left. Silfverskiold Test: positive left.   Radiographs: Taken and reviewed. No acute fractures. No evidence of calcaneal stress fracture.  Assessment & Plan:  Patient was evaluated and treated and all questions answered.  Plantar Fasciitis, left - XR reviewed as above.  - Educated on icing and stretching. Instructions given.  - Injection delivered to the plantar fascia. See below. - eRx Melociam - Night splint dispensed. Medically necessary for stretching of the plantar fascia.  Procedure: Injection Tendon/Ligament Location: Left plantar fascia at the glabrous junction; medial approach. Skin Prep: Alcohol. Injectate: 1 cc 0.5% marcaine plain, 1 cc dexamethasone phosphate, 0.5 cc kenalog 10. Disposition: Patient tolerated procedure well. Injection site dressed with a band-aid.  Return in about 6 weeks (around 06/05/2017).

## 2017-06-05 ENCOUNTER — Ambulatory Visit (INDEPENDENT_AMBULATORY_CARE_PROVIDER_SITE_OTHER): Payer: Medicare Other | Admitting: Podiatry

## 2017-06-05 ENCOUNTER — Encounter (INDEPENDENT_AMBULATORY_CARE_PROVIDER_SITE_OTHER): Payer: Self-pay

## 2017-06-05 DIAGNOSIS — M722 Plantar fascial fibromatosis: Secondary | ICD-10-CM | POA: Diagnosis not present

## 2017-06-24 NOTE — Progress Notes (Signed)
  Subjective:  Patient ID: Robert Oconnell., male    DOB: 08-Mar-1946,  MRN: [id number]  Chief Complaint  Patient presents with  . Plantar Fasciitis    Still having pain in left heel, not as bad as before. Using night splint daily 14min-1hr   71 y.o. male returns for the above complaint.  States that he is still having a little bit of pain in his left heel but is not as bad as before.  Objective:  There were no vitals filed for this visit. General AA&O x3. Normal mood and affect.  Vascular Pedal pulses palpable.  Neurologic Epicritic sensation grossly intact.  Dermatologic No open lesions. Skin normal texture and turgor.  Orthopedic: No pain to palpation either foot.   Assessment & Plan:  Patient was evaluated and treated and all questions answered.  Plantar fasciitis -Improved.  Defer injection today -Continue night splint and stretching -Advised to follow-up should pain persist  Return if symptoms worsen or fail to improve.

## 2018-01-07 ENCOUNTER — Ambulatory Visit: Payer: Medicare Other | Admitting: Allergy and Immunology

## 2018-01-07 ENCOUNTER — Encounter: Payer: Self-pay | Admitting: Allergy and Immunology

## 2018-01-07 VITALS — BP 134/84 | HR 64 | Resp 18

## 2018-01-07 DIAGNOSIS — K200 Eosinophilic esophagitis: Secondary | ICD-10-CM | POA: Diagnosis not present

## 2018-01-07 NOTE — Progress Notes (Signed)
Follow-up Note  Referring Provider: Angelina Sheriff, MD Primary Provider: Angelina Sheriff, MD Date of Office Visit: 01/07/2018  Subjective:   Robert Oconnell. (DOB: 1945/09/29) is a 72 y.o. male who returns to the [facility] on 01/07/2018 in re-evaluation of the following:  HPI: Robert Oconnell returns to this clinic in reevaluation of his eosinophilic esophagitis treated with dairy avoidance measures.  His last visit to this clinic was 08 January 2017.  He has been off his swallowed Flovent for approximately 18 months and he no longer uses omeprazole and the only therapy that he is performing is dairy avoidance measures.  He has no swallowing difficulties and has no heartburn and no indigestion.  Allergies as of 01/07/2018      Reactions   Nabumetone    Blistered skin   Tizanidine Other (See Comments)   Hallcinations, agitation      Medication List      Cyanocobalamin 1000 MCG/ML Kit Inject as directed.   simvastatin 40 MG tablet Commonly known as:  ZOCOR Take 40 mg by mouth at bedtime.   tamsulosin 0.4 MG Caps capsule Commonly known as:  FLOMAX Take 0.4 mg by mouth daily.       Past Medical History:  Diagnosis Date  . Eosinophilic esophagitis   . High cholesterol   . History of kidney stones   . Sleep apnea    CPAP used until he lost weight. No longer needs machine    Past Surgical History:  Procedure Laterality Date  . ADENOIDECTOMY    . CYSTOSCOPY WITH STENT PLACEMENT  past 10 years  . FOOT SURGERY Right   . HEMORRHOID SURGERY     past 10 years  . TONSILLECTOMY     age 39    Review of systems negative except as noted in HPI / PMHx or noted below:  Review of Systems  Constitutional: Negative.   HENT: Negative.   Eyes: Negative.   Respiratory: Negative.   Cardiovascular: Negative.   Gastrointestinal: Negative.   Genitourinary: Negative.   Musculoskeletal: Negative.   Skin: Negative.   Neurological: Negative.   Endo/Heme/Allergies:  Negative.   Psychiatric/Behavioral: Negative.      Objective:   Vitals:   01/07/18 1025  BP: 134/84  Pulse: 64  Resp: 18          Physical Exam  HENT:  Head: Normocephalic.  Right Ear: Tympanic membrane, external ear and ear canal normal.  Left Ear: Tympanic membrane, external ear and ear canal normal.  Nose: Nose normal. No mucosal edema or rhinorrhea.  Mouth/Throat: Uvula is midline, oropharynx is clear and moist and mucous membranes are normal. No oropharyngeal exudate.  Eyes: Conjunctivae are normal.  Neck: Trachea normal. No tracheal tenderness present. No tracheal deviation present. No thyromegaly present.  Cardiovascular: Normal rate, regular rhythm, S1 normal, S2 normal and normal heart sounds.  No murmur heard. Pulmonary/Chest: Breath sounds normal. No stridor. No respiratory distress. He has no wheezes. He has no rales.  Musculoskeletal: He exhibits no edema.  Lymphadenopathy:       Head (right side): No tonsillar adenopathy present.       Head (left side): No tonsillar adenopathy present.    He has no cervical adenopathy.  Neurological: He is alert.  Skin: No rash noted. He is not diaphoretic. No erythema. Nails show no clubbing.    Diagnostics: none  Assessment and Plan:   1. Eosinophilic esophagitis    Robert Oconnell is doing  very well at this point using dairy avoidance measures.  I do not see any need for him to return to this clinic for further evaluation unless of course he developed significant problems in the future.  He will remain without consumption of dairy at this point.  He appears to be amendable to performing dairy avoidance measures on a long-term basis.  [name], MD Allergy / Immunology [place]

## 2018-01-08 ENCOUNTER — Encounter: Payer: Self-pay | Admitting: Allergy and Immunology

## 2019-08-25 DIAGNOSIS — Z0000 Encounter for general adult medical examination without abnormal findings: Secondary | ICD-10-CM | POA: Diagnosis not present

## 2019-08-25 DIAGNOSIS — E785 Hyperlipidemia, unspecified: Secondary | ICD-10-CM | POA: Diagnosis not present

## 2019-08-25 DIAGNOSIS — Z6835 Body mass index (BMI) 35.0-35.9, adult: Secondary | ICD-10-CM | POA: Diagnosis not present

## 2019-08-25 DIAGNOSIS — D519 Vitamin B12 deficiency anemia, unspecified: Secondary | ICD-10-CM | POA: Diagnosis not present

## 2019-09-10 DIAGNOSIS — H26491 Other secondary cataract, right eye: Secondary | ICD-10-CM | POA: Diagnosis not present

## 2019-09-18 ENCOUNTER — Ambulatory Visit: Payer: Self-pay | Admitting: Neurology

## 2019-10-28 ENCOUNTER — Ambulatory Visit: Payer: Medicare PPO | Admitting: Neurology

## 2019-10-28 ENCOUNTER — Encounter: Payer: Self-pay | Admitting: Neurology

## 2019-10-28 ENCOUNTER — Other Ambulatory Visit: Payer: Self-pay

## 2019-10-28 DIAGNOSIS — R413 Other amnesia: Secondary | ICD-10-CM | POA: Diagnosis not present

## 2019-10-28 DIAGNOSIS — G250 Essential tremor: Secondary | ICD-10-CM

## 2019-10-28 HISTORY — DX: Essential tremor: G25.0

## 2019-10-28 NOTE — Progress Notes (Signed)
Reason for visit: Mild memory disturbance, tremor  Referring physician: Dr. Bedelia Person [name]. is a 74 y.o. male  History of present illness:  Robert Oconnell is a 74 year old right-handed white male with a history of some difficulty with handwriting that began about 1 year ago and this has slightly worsened over time.  He mainly notes tremor with his right hand, he is right-handed.  He indicates that the handwriting is affected, he will see tremor that is translated into the handwriting.  He has started to print more as this is more legible for him.  Around the same time that the tremor began, he has noted some mild forgetfulness.  This has not altered any of his activities of daily living.  He does have a history of vitamin B12 deficiency and is on supplementation for this.  He is able to operate a motor vehicle without much difficulty, occasionally he will get turned around with directions.  He has not had any significant issues managing the finances or managing his medications or appointments.  He does snore at night, he may nap during the day but he does not have significant daytime drowsiness or fatigue.  He does not drink a lot of caffeinated products.  He reports no numbness or weakness of extremities, he denies any balance issues or difficulty controlling the bowels or the bladder.  He reports no family history of tremor although his father died in his 82s.  He does not know a lot about his father side of the family.  The patient does not report any significant problems with feeding himself.  He is sent to this office for an evaluation.  Past Medical History:  Diagnosis Date  . Eosinophilic esophagitis   . High cholesterol   . History of kidney stones   . Memory disorder 10/28/2019  . Sleep apnea    CPAP used until he lost weight. No longer needs machine  . 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 ADENOIDECTOMY    . CYSTOSCOPY WITH STENT  PLACEMENT  past 10 years  . FOOT SURGERY Right   . HEMORRHOID SURGERY     past 10 years  . TONSILLECTOMY     age 30    Family History  Problem Relation Age of Onset  . Cancer Mother   . Cancer Father     Social history:  reports that he has never smoked. He has never used smokeless tobacco. He reports that he does not drink alcohol or use drugs.  Medications:  Prior to Admission medications   Medication Sig Start Date End Date Taking? Authorizing Provider  cyanocobalamin (,VITAMIN B-12,) 1000 MCG/ML injection  09/29/19  Yes [provider]  Cyanocobalamin 1000 MCG/ML KIT Inject as directed.   Yes [provider]  simvastatin (ZOCOR) 20 MG tablet  10/08/19  Yes [provider]  simvastatin (ZOCOR) 40 MG tablet Take 40 mg by mouth at bedtime.   Yes [provider]  tamsulosin (FLOMAX) 0.4 MG CAPS capsule Take 0.4 mg by mouth daily.   Yes [provider]      Allergies  Allergen Reactions  . Nabumetone     Blistered skin  . Tizanidine Other (See Comments)    Hallcinations, agitation    ROS:  Out of a complete 14 system review of symptoms, the patient complains only of the following symptoms, and all other reviewed systems are negative.  Tremor Mild memory disturbance  Snoring  Blood pressure 126/79, pulse 65, temperature (!) 97.1 F (36.2 C), height _0  (1.626 m), weight 215 lb (97.5 kg).  Physical Exam  General: The patient is alert and cooperative at the time of the examination.  The patient is moderately obese.  Eyes: Pupils are equal, round, and reactive to light. Discs are flat bilaterally.  Neck: The neck is supple, no carotid bruits are noted.  Respiratory: The respiratory examination is clear.  Cardiovascular: The cardiovascular examination reveals a regular rate and rhythm, no obvious murmurs or rubs are noted.  Skin: Extremities are without significant edema.  Neurologic Exam  Mental status: The patient is  alert and oriented x 3 at the time of the examination. The Mini-Mental Status Examination done today shows a total score of 28/30.  The patient is able to name 7 four-legged animals in 60 seconds.  Cranial nerves: Facial symmetry is present. There is good sensation of the face to pinprick and soft touch bilaterally. The strength of the facial muscles and the muscles to head turning and shoulder shrug are normal bilaterally. Speech is well enunciated, no aphasia or dysarthria is noted. Extraocular movements are full. Visual fields are full. The tongue is midline, and the patient has symmetric elevation of the soft palate. No obvious hearing deficits are noted.  Motor: The motor testing reveals 5 over 5 strength of all 4 extremities. Good symmetric motor tone is noted throughout.  Sensory: Sensory testing is intact to pinprick, soft touch, vibration sensation, and position sense on all 4 extremities, with exception of some decreased position sense in the right foot. No evidence of extinction is noted.  Coordination: Cerebellar testing reveals good finger-nose-finger and heel-to-shin bilaterally.  When drawing a spiral, tremor is translated into the handwriting.  With finger-nose-finger, tremor seen bilaterally with the right and left upper extremities, and appears to be symmetric.  Gait and station: Gait is normal. Tandem gait is normal. Romberg is negative. No drift is seen.  Good arm swing is seen with walking.  Reflexes: Deep tendon reflexes are symmetric and normal bilaterally. Toes are downgoing bilaterally.   Assessment/Plan:  1.  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  Mild memory disturbance  The patient does have a history of a B12 deficiency but is on medication for this.  We will set him up for MRI of the brain.  We will follow the memory issues and tremor over time.  The patient does not wish to go on medication for tremor at this time.  Robert Alexanders MD 10/28/2019 10:13  AM  [facility] [address] [place], [place] [address]  Phone [phone] Fax [phone]

## 2019-10-29 ENCOUNTER — Telehealth: Payer: Self-pay | Admitting: Neurology

## 2019-10-29 NOTE — Telephone Encounter (Signed)
[employer] pending faxed notes.  

## 2019-10-30 NOTE — Telephone Encounter (Signed)
Robert Oconnell Robert Oconnell: [id number] (exp. 10/29/19 to 11/28/19) order sent to GI. They will reach out to the patient to schedule.

## 2019-11-29 ENCOUNTER — Other Ambulatory Visit: Payer: Self-pay

## 2019-11-29 ENCOUNTER — Ambulatory Visit
Admission: RE | Admit: 2019-11-29 | Discharge: 2019-11-29 | Disposition: A | Discharge status: Home / Self Care | Payer: Medicare PPO | Source: Ambulatory Visit | Attending: Neurology | Admitting: Neurology

## 2019-11-29 DIAGNOSIS — R413 Other amnesia: Secondary | ICD-10-CM

## 2019-12-01 ENCOUNTER — Telehealth: Payer: Self-pay | Admitting: Neurology

## 2019-12-01 NOTE — Telephone Encounter (Signed)
I called the patient.  MRI of the brain.  Show some mild generalized atrophy but no significant small vessel disease was noted.  The patient be followed for his essential tremor and for the memory issues.  He does not wish to go on medications for either at this time.    MRI brain 11/29/19:  IMPRESSION: This MRI of the brain without contrast shows the following: 1.   Mild generalized cortical atrophy. 2.   Some scattered T2/FLAIR hyperintense foci consistent with minimal chronic microvascular ischemic change, typical for age. 3.   There were no acute findings.

## 2020-01-06 DIAGNOSIS — L820 Inflamed seborrheic keratosis: Secondary | ICD-10-CM | POA: Diagnosis not present

## 2020-02-24 DIAGNOSIS — N202 Calculus of kidney with calculus of ureter: Secondary | ICD-10-CM | POA: Diagnosis not present

## 2020-02-24 DIAGNOSIS — R311 Benign essential microscopic hematuria: Secondary | ICD-10-CM | POA: Diagnosis not present

## 2020-03-02 DIAGNOSIS — K449 Diaphragmatic hernia without obstruction or gangrene: Secondary | ICD-10-CM | POA: Diagnosis not present

## 2020-03-02 DIAGNOSIS — N400 Enlarged prostate without lower urinary tract symptoms: Secondary | ICD-10-CM | POA: Diagnosis not present

## 2020-03-02 DIAGNOSIS — N201 Calculus of ureter: Secondary | ICD-10-CM | POA: Diagnosis not present

## 2020-03-02 DIAGNOSIS — N202 Calculus of kidney with calculus of ureter: Secondary | ICD-10-CM | POA: Diagnosis not present

## 2020-03-02 DIAGNOSIS — N200 Calculus of kidney: Secondary | ICD-10-CM | POA: Diagnosis not present

## 2020-03-16 DIAGNOSIS — N202 Calculus of kidney with calculus of ureter: Secondary | ICD-10-CM | POA: Diagnosis not present

## 2020-03-16 DIAGNOSIS — N281 Cyst of kidney, acquired: Secondary | ICD-10-CM | POA: Diagnosis not present

## 2020-03-16 DIAGNOSIS — R351 Nocturia: Secondary | ICD-10-CM | POA: Diagnosis not present

## 2020-03-17 ENCOUNTER — Encounter (HOSPITAL_BASED_OUTPATIENT_CLINIC_OR_DEPARTMENT_OTHER): Payer: Self-pay | Admitting: Urology

## 2020-03-17 ENCOUNTER — Other Ambulatory Visit: Payer: Self-pay | Admitting: Urology

## 2020-03-18 ENCOUNTER — Encounter (HOSPITAL_BASED_OUTPATIENT_CLINIC_OR_DEPARTMENT_OTHER): Payer: Self-pay | Admitting: Urology

## 2020-03-18 ENCOUNTER — Other Ambulatory Visit: Payer: Self-pay

## 2020-03-18 NOTE — Progress Notes (Signed)
Spoke w/ via phone for pre-op interview--- PT Lab needs dos---- no              Lab results------no COVID test ------ 03-20-2020 @ 0910 Arrive at ------- 0615 NPO after MN NO Solid Food.  Clear liquids from MN until--- 0515 Medications to take morning of surgery ----- NONE Diabetic medication ----- n/a Patient Special Instructions ----- n/a Pre-Op special Istructions ----- pt has mild memory issue, possible may need help with meds in pre-op Patient verbalized understanding of instructions that were given at this phone interview. Patient denies shortness of breath, chest pain, fever, cough at this phone interview.

## 2020-03-20 ENCOUNTER — Other Ambulatory Visit (HOSPITAL_COMMUNITY)
Admission: RE | Admit: 2020-03-20 | Discharge: 2020-03-20 | Disposition: A | Discharge status: Home / Self Care | Payer: Medicare PPO | Source: Ambulatory Visit | Attending: Urology | Admitting: Urology

## 2020-03-20 DIAGNOSIS — Z01812 Encounter for preprocedural laboratory examination: Secondary | ICD-10-CM | POA: Diagnosis not present

## 2020-03-20 DIAGNOSIS — Z20822 Contact with and (suspected) exposure to covid-19: Secondary | ICD-10-CM | POA: Insufficient documentation

## 2020-03-20 LAB — SARS CORONAVIRUS 2 (TAT 6-24 HRS): SARS Coronavirus 2: NEGATIVE

## 2020-03-23 DIAGNOSIS — N202 Calculus of kidney with calculus of ureter: Secondary | ICD-10-CM | POA: Diagnosis not present

## 2020-03-23 NOTE — Anesthesia Preprocedure Evaluation (Addendum)
Anesthesia Evaluation  Patient identified by MRN, date of birth, ID band Patient awake    Reviewed: Allergy & Precautions, NPO status , Patient's Chart, lab work & pertinent test results  History of Anesthesia Complications Negative for: history of anesthetic complications  Airway Mallampati: III  TM Distance: >3 FB Neck ROM: Full    Dental no notable dental hx.    Pulmonary sleep apnea ,    Pulmonary exam normal        Cardiovascular negative cardio ROS Normal cardiovascular exam     Neuro/Psych negative neurological ROS  negative psych ROS   GI/Hepatic negative GI ROS, Neg liver ROS,   Endo/Other  BMI 35  Renal/GU Renal stones  negative genitourinary   Musculoskeletal negative musculoskeletal ROS (+)   Abdominal   Peds  Hematology negative hematology ROS (+)   Anesthesia Other Findings Day of surgery medications reviewed with patient.  Reproductive/Obstetrics negative OB ROS                            Anesthesia Physical Anesthesia Plan  ASA: II  Anesthesia Plan: General   Post-op Pain Management:    Induction: Intravenous  PONV Risk Score and Plan: 2 and Treatment may vary due to age or medical condition, Ondansetron and Dexamethasone  Airway Management Planned: LMA  Additional Equipment: None  Intra-op Plan:   Post-operative Plan: Extubation in OR  Informed Consent: I have reviewed the patients History and Physical, chart, labs and discussed the procedure including the risks, benefits and alternatives for the proposed anesthesia with the patient or authorized representative who has indicated his/her understanding and acceptance.       Plan Discussed with:   Anesthesia Plan Comments:        Anesthesia Quick Evaluation

## 2020-03-24 ENCOUNTER — Ambulatory Visit (HOSPITAL_BASED_OUTPATIENT_CLINIC_OR_DEPARTMENT_OTHER)
Admission: RE | Admit: 2020-03-24 | Discharge: 2020-03-24 | Disposition: A | Discharge status: Home / Self Care | Payer: Medicare PPO | Attending: Urology | Admitting: Urology

## 2020-03-24 ENCOUNTER — Encounter (HOSPITAL_BASED_OUTPATIENT_CLINIC_OR_DEPARTMENT_OTHER)
Admission: RE | Disposition: A | Discharge status: Home / Self Care | Payer: Self-pay | Source: Home / Self Care | Attending: Urology

## 2020-03-24 ENCOUNTER — Ambulatory Visit (HOSPITAL_BASED_OUTPATIENT_CLINIC_OR_DEPARTMENT_OTHER): Payer: Medicare PPO | Admitting: Anesthesiology

## 2020-03-24 ENCOUNTER — Encounter (HOSPITAL_BASED_OUTPATIENT_CLINIC_OR_DEPARTMENT_OTHER): Payer: Self-pay | Admitting: Urology

## 2020-03-24 ENCOUNTER — Other Ambulatory Visit: Payer: Self-pay

## 2020-03-24 DIAGNOSIS — I10 Essential (primary) hypertension: Secondary | ICD-10-CM | POA: Diagnosis not present

## 2020-03-24 DIAGNOSIS — N132 Hydronephrosis with renal and ureteral calculous obstruction: Secondary | ICD-10-CM | POA: Insufficient documentation

## 2020-03-24 DIAGNOSIS — Z888 Allergy status to other drugs, medicaments and biological substances status: Secondary | ICD-10-CM | POA: Insufficient documentation

## 2020-03-24 DIAGNOSIS — N400 Enlarged prostate without lower urinary tract symptoms: Secondary | ICD-10-CM | POA: Diagnosis not present

## 2020-03-24 DIAGNOSIS — Z6834 Body mass index (BMI) 34.0-34.9, adult: Secondary | ICD-10-CM | POA: Insufficient documentation

## 2020-03-24 DIAGNOSIS — E669 Obesity, unspecified: Secondary | ICD-10-CM | POA: Diagnosis not present

## 2020-03-24 DIAGNOSIS — Z79899 Other long term (current) drug therapy: Secondary | ICD-10-CM | POA: Diagnosis not present

## 2020-03-24 DIAGNOSIS — G4733 Obstructive sleep apnea (adult) (pediatric): Secondary | ICD-10-CM | POA: Diagnosis not present

## 2020-03-24 DIAGNOSIS — G4730 Sleep apnea, unspecified: Secondary | ICD-10-CM | POA: Diagnosis not present

## 2020-03-24 DIAGNOSIS — N202 Calculus of kidney with calculus of ureter: Secondary | ICD-10-CM | POA: Diagnosis not present

## 2020-03-24 DIAGNOSIS — Z87442 Personal history of urinary calculi: Secondary | ICD-10-CM | POA: Diagnosis not present

## 2020-03-24 DIAGNOSIS — E785 Hyperlipidemia, unspecified: Secondary | ICD-10-CM | POA: Insufficient documentation

## 2020-03-24 HISTORY — DX: Hyperlipidemia, unspecified: E78.5

## 2020-03-24 HISTORY — DX: Calculus of kidney: N20.0

## 2020-03-24 HISTORY — PX: CYSTOSCOPY WITH RETROGRADE PYELOGRAM, URETEROSCOPY AND STENT PLACEMENT: SHX5789

## 2020-03-24 HISTORY — DX: Nocturia: R35.1

## 2020-03-24 HISTORY — PX: HOLMIUM LASER APPLICATION: SHX5852

## 2020-03-24 HISTORY — DX: Other amnesia: R41.3

## 2020-03-24 HISTORY — DX: Personal history of other diseases of the digestive system: Z87.19

## 2020-03-24 HISTORY — DX: Obstructive sleep apnea (adult) (pediatric): G47.33

## 2020-03-24 HISTORY — DX: Urgency of urination: R39.15

## 2020-03-24 SURGERY — CYSTOURETEROSCOPY, WITH RETROGRADE PYELOGRAM AND STENT INSERTION
Anesthesia: General | Laterality: Bilateral

## 2020-03-24 MED ORDER — DEXAMETHASONE SODIUM PHOSPHATE 10 MG/ML IJ SOLN
INTRAMUSCULAR | Status: AC
  Filled 2020-03-24: qty 1

## 2020-03-24 MED ORDER — PROMETHAZINE HCL 25 MG/ML IJ SOLN: 6.2500 mg | INTRAMUSCULAR | Status: DC | PRN

## 2020-03-24 MED ORDER — OXYCODONE-ACETAMINOPHEN 5-325 MG PO TABS
1.0000 | ORAL_TABLET | Freq: Four times a day (QID) | ORAL | 0 refills | Status: DC | PRN
Start: 2020-03-24 — End: 2020-04-14

## 2020-03-24 MED ORDER — KETOROLAC TROMETHAMINE 10 MG PO TABS: 10.0000 mg | ORAL_TABLET | Freq: Four times a day (QID) | ORAL | 0 refills | Status: DC | PRN

## 2020-03-24 MED ORDER — CEFAZOLIN SODIUM-DEXTROSE 2-4 GM/100ML-% IV SOLN
2.0000 g | INTRAVENOUS | Status: AC
  Administered 2020-03-24: 2 g via INTRAVENOUS

## 2020-03-24 MED ORDER — EPHEDRINE SULFATE-NACL 50-0.9 MG/10ML-% IV SOSY
PREFILLED_SYRINGE | INTRAVENOUS | Status: DC | PRN
  Administered 2020-03-24 (×2): 10 mg via INTRAVENOUS

## 2020-03-24 MED ORDER — FENTANYL CITRATE (PF) 100 MCG/2ML IJ SOLN
INTRAMUSCULAR | Status: AC
  Filled 2020-03-24: qty 2

## 2020-03-24 MED ORDER — LACTATED RINGERS IV SOLN
INTRAVENOUS | Status: DC
  Administered 2020-03-24: 50 mL via INTRAVENOUS

## 2020-03-24 MED ORDER — OXYCODONE HCL 5 MG/5ML PO SOLN: 5.0000 mg | Freq: Once | ORAL | Status: DC | PRN

## 2020-03-24 MED ORDER — ACETAMINOPHEN 500 MG PO TABS
ORAL_TABLET | ORAL | Status: AC
  Filled 2020-03-24: qty 2

## 2020-03-24 MED ORDER — ONDANSETRON HCL 4 MG/2ML IJ SOLN
INTRAMUSCULAR | Status: DC | PRN
  Administered 2020-03-24: 4 mg via INTRAVENOUS

## 2020-03-24 MED ORDER — OXYCODONE HCL 5 MG PO TABS: 5.0000 mg | ORAL_TABLET | Freq: Once | ORAL | Status: DC | PRN

## 2020-03-24 MED ORDER — DEXAMETHASONE SODIUM PHOSPHATE 10 MG/ML IJ SOLN
INTRAMUSCULAR | Status: DC | PRN
  Administered 2020-03-24: 5 mg via INTRAVENOUS

## 2020-03-24 MED ORDER — IOHEXOL 300 MG/ML  SOLN
INTRAMUSCULAR | Status: DC | PRN
  Administered 2020-03-24: 35 mL via URETHRAL

## 2020-03-24 MED ORDER — CEFAZOLIN SODIUM-DEXTROSE 2-4 GM/100ML-% IV SOLN
INTRAVENOUS | Status: AC
  Filled 2020-03-24: qty 100

## 2020-03-24 MED ORDER — PROPOFOL 10 MG/ML IV BOLUS
INTRAVENOUS | Status: AC
  Filled 2020-03-24: qty 20

## 2020-03-24 MED ORDER — SENNOSIDES-DOCUSATE SODIUM 8.6-50 MG PO TABS: 1.0000 | ORAL_TABLET | Freq: Two times a day (BID) | ORAL | 0 refills | Status: DC

## 2020-03-24 MED ORDER — LIDOCAINE 2% (20 MG/ML) 5 ML SYRINGE
INTRAMUSCULAR | Status: AC
  Filled 2020-03-24: qty 5

## 2020-03-24 MED ORDER — PROPOFOL 10 MG/ML IV BOLUS
INTRAVENOUS | Status: DC | PRN
  Administered 2020-03-24: 150 mg via INTRAVENOUS

## 2020-03-24 MED ORDER — ACETAMINOPHEN 500 MG PO TABS
1000.0000 mg | ORAL_TABLET | Freq: Once | ORAL | Status: AC
  Administered 2020-03-24: 1000 mg via ORAL

## 2020-03-24 MED ORDER — KETOROLAC TROMETHAMINE 30 MG/ML IJ SOLN
INTRAMUSCULAR | Status: AC
  Filled 2020-03-24: qty 1

## 2020-03-24 MED ORDER — FENTANYL CITRATE (PF) 100 MCG/2ML IJ SOLN
INTRAMUSCULAR | Status: DC | PRN
  Administered 2020-03-24: 25 ug via INTRAVENOUS
  Administered 2020-03-24: 75 ug via INTRAVENOUS

## 2020-03-24 MED ORDER — FENTANYL CITRATE (PF) 100 MCG/2ML IJ SOLN: 25.0000 ug | INTRAMUSCULAR | Status: DC | PRN

## 2020-03-24 MED ORDER — LIDOCAINE 2% (20 MG/ML) 5 ML SYRINGE
INTRAMUSCULAR | Status: DC | PRN
  Administered 2020-03-24: 50 mg via INTRAVENOUS

## 2020-03-24 MED ORDER — ONDANSETRON HCL 4 MG/2ML IJ SOLN
INTRAMUSCULAR | Status: AC
  Filled 2020-03-24: qty 2

## 2020-03-24 SURGICAL SUPPLY — 24 items
BAG DRAIN URO-CYSTO SKYTR STRL (DRAIN) ×12 IMPLANT
BAG DRN UROCATH (DRAIN) ×4
BASKET LASER NITINOL 1.9FR (BASKET) ×6 IMPLANT
BSKT STON RTRVL 120 1.9FR (BASKET) ×2
CATH INTERMIT  6FR 70CM (CATHETERS) ×3 IMPLANT
CLOTH BEACON ORANGE TIMEOUT ST (SAFETY) ×3 IMPLANT
FIBER LASER FLEXIVA 365 (UROLOGICAL SUPPLIES) IMPLANT
FIBER LASER TRAC TIP (UROLOGICAL SUPPLIES) ×3 IMPLANT
GLOVE BIO SURGEON STRL SZ7.5 (GLOVE) ×3 IMPLANT
GOWN STRL REUS W/TWL LRG LVL3 (GOWN DISPOSABLE) ×3 IMPLANT
GUIDEWIRE ANG ZIPWIRE 038X150 (WIRE) ×6 IMPLANT
GUIDEWIRE STR DUAL SENSOR (WIRE) ×6 IMPLANT
IV NS 1000ML (IV SOLUTION) ×3
IV NS 1000ML BAXH (IV SOLUTION) ×1 IMPLANT
IV NS IRRIG 3000ML ARTHROMATIC (IV SOLUTION) ×3 IMPLANT
KIT TURNOVER CYSTO (KITS) ×3 IMPLANT
MANIFOLD NEPTUNE II (INSTRUMENTS) ×3 IMPLANT
NS IRRIG 500ML POUR BTL (IV SOLUTION) ×3 IMPLANT
PACK CYSTO (CUSTOM PROCEDURE TRAY) ×3 IMPLANT
SYR 10ML LL (SYRINGE) ×3 IMPLANT
TUBE CONNECTING 12'X1/4 (SUCTIONS) ×1
TUBE CONNECTING 12X1/4 (SUCTIONS) ×2 IMPLANT
TUBE FEEDING 8FR 16IN STR KANG (MISCELLANEOUS) ×3 IMPLANT
TUBING UROLOGY SET (TUBING) ×3 IMPLANT

## 2020-03-24 NOTE — H&P (Signed)
Robert Oconnell. is an 74 y.o. male.    Chief Complaint: PRe-OP BILATERAL Ureteroscopic Stone Manipulation  HPI:   1 - Recurrent Nephroilithiasis -  2015- unilateral SWL, composition 100% CaOx  12/2017 - KUB/RUS Rt 99mm mid, Left lower pole 34mm x 3 stones, ? left hydro v. peripelvic cysts (CT confirmed cysts only)  03/2019 - KUB/RUS about 5-10% incrase stone volume bilateral and stable left peripelvic cysts  02/2020 - 1cm Rt distal ureteral stone group (smal renal as well, minimal hydro), about 34mm left renal stone burden, non-obstructin by CT.   2 - Lower Urinary Tract Symptoms - on tamsulosin at [facility] with good control of mild LUTS. Prostate Vol 144mL with median lobe by CT ellipsoid calculation 2019.   3 - Left Renal Peripelvic Cysts v. Hydro renal US 06/2017 and 12/2017 with ? left hydro v. peri-pelvic cysts. Dedicated contrast CT 01/2018 confirms only non-complex peri-pelvic cysts, no hydro.     PMH sig for HTN, mild obesity. NO ischmic CV diseas / blood thinners. 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 His PCP is Lovette Cliche MD with [facility] in [place].   Today "Robert Oconnell" is seen to proceed with BILATERAL ureteroscopy wtih goal of stone free. No interval fevers. Most recent UA without infectious parameters. C19 screen negative.     Past Medical History:  Diagnosis Date  . Eosinophilic esophagitis 61/4431   dx thru EGD w/ bx by dr Lyndel Safe;  followed by allergy / asthma clinic last note in epic  . History of gastroesophageal reflux (GERD)   . History of kidney stones   . Hyperlipidemia   . Mild memory disturbance    neurologist--- dr Jannifer Franklin  . Nocturia   . OSA (obstructive sleep apnea)    per pt  used cpap until he lost weight, stated no longer needed it,  last used approx. 2013  . Renal calculus, left   . Right ureteral calculus   . 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 Urgency of urination     Past Surgical History:  Procedure  Laterality Date  . CATARACT EXTRACTION W/ INTRAOCULAR LENS  IMPLANT, BILATERAL  2010  . CYSTOSCOPY WITH STENT PLACEMENT Left 12/2010  . ESOPHAGOGASTRODUODENOSCOPY (EGD) WITH ESOPHAGEAL DILATION  03/ 2017  dr Lyndel Safe  . EXTRACORPOREAL SHOCK WAVE LITHOTRIPSY  02-27-2011;  06-29-2014  @[facility]   . FOOT SURGERY Right 1990s   removal growth, benign per pt  . HEMORRHOID SURGERY  2007 approx.  . TONSILLECTOMY AND ADENOIDECTOMY  age 42    Family History  Problem Relation Age of Onset  . Cancer Mother   . Cancer Father    Social History:  reports that he has never smoked. He has never used smokeless tobacco. He reports that he does not drink alcohol and does not use drugs.  Allergies:  Allergies  Allergen Reactions  . Nabumetone     Blistered skin  . Tizanidine Other (See Comments)    Hallcinations, agitation    Medications Prior to Admission  Medication Sig Dispense Refill  . Polyvinyl Alcohol-Povidone (REFRESH OP) Apply to eye daily.    . simvastatin (ZOCOR) 20 MG tablet Take 20 mg by mouth at bedtime.     . tamsulosin (FLOMAX) 0.4 MG CAPS capsule Take 0.4 mg by mouth at bedtime.     . cyanocobalamin (,VITAMIN B-12,) 1000 MCG/ML injection Inject 1,000 mcg into the muscle every 30 (thirty) days. Pt states his primary manages the b12 and his daughter does  it for him      No results found for this or any previous visit (from the past 48 hour(s)). No results found.  Review of Systems  Constitutional: Negative.  Negative for chills and fatigue.  Genitourinary: Positive for flank pain.  All other systems reviewed and are negative.   Height 5\' 5"  (1.651 m), weight 95.3 kg. Physical Exam Vitals reviewed.  HENT:     Head: Normocephalic.     Nose: Nose normal.  Eyes:     Pupils: Pupils are equal, round, and reactive to light.  Cardiovascular:     Rate and Rhythm: Normal rate.  Pulmonary:     Effort: Pulmonary effort is normal.  Abdominal:     Comments: Stable mild obeisty.    Genitourinary:    Comments: Minimal Rt CVAT at present.  Musculoskeletal:        General: Normal range of motion.     Cervical back: Normal range of motion.  Skin:    General: Skin is warm.  Neurological:     General: No focal deficit present.     Mental Status: He is alert.  Psychiatric:        Mood and Affect: Mood normal.      Assessment/Plan  Proceed as planned with cyst BILATERAL ureteroscopic stone manipulation with goal of stone free. Risks, benefits, alternatives, expected peri-op course incluidng need for possibl staged approach and temporary stents discussed previously and reiterated today.   [name], MD 03/24/2020, 6:28 AM

## 2020-03-24 NOTE — Anesthesia Postprocedure Evaluation (Signed)
Anesthesia Post Note  Patient: Robert Oconnell.  Procedure(s) Performed: CYSTOSCOPY WITH RETROGRADE PYELOGRAM, URETEROSCOPY AND STENT PLACEMENT STONE BASKET RETRIVAL (Bilateral ) HOLMIUM LASER APPLICATION (Bilateral )     Patient location during evaluation: PACU Anesthesia Type: General Level of consciousness: awake and alert and oriented Pain management: pain level controlled Vital Signs Assessment: post-procedure vital signs reviewed and stable Respiratory status: spontaneous breathing, nonlabored ventilation and respiratory function stable Cardiovascular status: blood pressure returned to baseline Postop Assessment: no apparent nausea or vomiting Anesthetic complications: no   No complications documented.  Last Vitals:  Vitals:   03/24/20 1015 03/24/20 1030  BP: 136/73   Pulse: 64 67  Resp: 16 13  Temp:  37 C  SpO2: 97% 94%    Last Pain:  Vitals:   03/24/20 1030  TempSrc:   PainSc: 0-No pain                 Brennan Bailey

## 2020-03-24 NOTE — Anesthesia Procedure Notes (Signed)
Procedure Name: LMA Insertion Date/Time: 03/24/2020 8:03 AM Performed by: Suan Halter, CRNA Pre-anesthesia Checklist: Patient identified, Emergency Drugs available, Suction available and Patient being monitored Patient Re-evaluated:Patient Re-evaluated prior to induction Oxygen Delivery Method: Circle system utilized Preoxygenation: Pre-oxygenation with 100% oxygen Induction Type: IV induction Ventilation: Mask ventilation without difficulty LMA: LMA inserted LMA Size: 4.0 Number of attempts: 1 Airway Equipment and Method: Bite block Placement Confirmation: positive ETCO2 Tube secured with: Tape Dental Injury: Teeth and Oropharynx as per pre-operative assessment

## 2020-03-24 NOTE — Transfer of Care (Signed)
Immediate Anesthesia Transfer of Care Note  Patient: Robert Oconnell.  Procedure(s) Performed: Procedure(s) (LRB): CYSTOSCOPY WITH RETROGRADE PYELOGRAM, URETEROSCOPY AND STENT PLACEMENT STONE BASKET RETRIVAL (Bilateral) HOLMIUM LASER APPLICATION (Bilateral)  Patient Location: PACU  Anesthesia Type: General  Level of Consciousness: awake, oriented, sedated and patient cooperative  Airway & Oxygen Therapy: Patient Spontanous Breathing and Patient connected to face mask oxygen  Post-op Assessment: Report given to PACU RN and Post -op Vital signs reviewed and stable  Post vital signs: Reviewed and stable  Complications: No apparent anesthesia complications  Last Vitals:  Vitals Value Taken Time  BP 146/71 03/24/20 1030  Temp 37 C 03/24/20 1030  Pulse 69 03/24/20 1033  Resp 15 03/24/20 1032  SpO2 93 % 03/24/20 1033  Vitals shown include unvalidated device data.  Last Pain:  Vitals:   03/24/20 1030  TempSrc:   PainSc: 0-No pain      Patients Stated Pain Goal: 3 (56/81/27 5170)  Complications: No complications documented.

## 2020-03-24 NOTE — Brief Op Note (Signed)
03/24/2020  10:09 AM  PATIENT:  Robert Oconnell.  74 y.o. male  PRE-OPERATIVE DIAGNOSIS:  RIGHT URETERAL STONE AND  RENAL STONE  POST-OPERATIVE DIAGNOSIS:  BILATERAL  URETERAL STONE AND RIGHT RENAL STONE  PROCEDURE:  Procedure(s) with comments: CYSTOSCOPY WITH RETROGRADE PYELOGRAM, URETEROSCOPY AND STENT PLACEMENT STONE BASKET RETRIVAL (Bilateral) - 75 MINS HOLMIUM LASER APPLICATION (Bilateral)  SURGEON:  Surgeon(s) and Role:    * [name], MD - Primary  PHYSICIAN ASSISTANT:   ASSISTANTS: none   ANESTHESIA:   general  EBL:  minimal   BLOOD ADMINISTERED:none  DRAINS: none   LOCAL MEDICATIONS USED:  NONE  SPECIMEN:  Source of Specimen:  bilateral ureteral and renal stone fragments  DISPOSITION OF SPECIMEN:  [facility] for compositional analysis  COUNTS:  YES  TOURNIQUET:  * No tourniquets in log *  DICTATION: .Other Dictation: Dictation Number   [id number]  PLAN OF CARE: Discharge to home after PACU  PATIENT DISPOSITION:  PACU - hemodynamically stable.   Delay start of Pharmacological VTE agent (>24hrs) due to surgical blood loss or risk of bleeding: yes

## 2020-03-24 NOTE — Discharge Instructions (Signed)
1 - You may have urinary urgency (bladder spasms) and bloody urine on / off with stent in place. This is normal.  2 - Call MD or go to ER for fever >102, severe pain / nausea / vomiting not relieved by medications, or acute change in medical status  Ureteral Stent Implantation, Care After This sheet gives you information about how to care for yourself after your procedure. Your health care provider may also give you more specific instructions. If you have problems or questions, contact your health care provider. What can I expect after the procedure? After the procedure, it is common to have:  Nausea.  Mild pain when you urinate. You may feel this pain in your lower back or lower abdomen. The pain should stop within a few minutes after you urinate. This may last for up to 1 week.  A small amount of blood in your urine for several days. Follow these instructions at home: Medicines  Take over-the-counter and prescription medicines only as told by your health care provider.  If you were prescribed an antibiotic medicine, take it as told by your health care provider. Do not stop taking the antibiotic even if you start to feel better.  Do not drive for 24 hours if you were given a sedative during your procedure.  Ask your health care provider if the medicine prescribed to you requires you to avoid driving or using heavy machinery. Activity  Rest as told by your health care provider.  Avoid sitting for a long time without moving. Get up to take short walks every 1-2 hours. This is important to improve blood flow and breathing. Ask for help if you feel weak or unsteady.  Return to your normal activities as told by your health care provider. Ask your health care provider what activities are safe for you. General instructions   Watch for any blood in your urine. Call your health care provider if the amount of blood in your urine increases.  If you have a catheter: ? Follow instructions  from your health care provider about taking care of your catheter and collection bag. ? Do not take baths, swim, or use a hot tub until your health care provider approves. Ask your health care provider if you may take showers. You may only be allowed to take sponge baths.  Drink enough fluid to keep your urine pale yellow.  Do not use any products that contain nicotine or tobacco, such as cigarettes, e-cigarettes, and chewing tobacco. These can delay healing after surgery. If you need help quitting, ask your health care provider.  Keep all follow-up visits as told by your health care provider. This is important. Contact a health care provider if:  You have pain that gets worse or does not get better with medicine, especially pain when you urinate.  You have difficulty urinating.  You feel nauseous or you vomit repeatedly during a period of more than 2 days after the procedure. Get help right away if:  Your urine is dark red or has blood clots in it.  You are leaking urine (have incontinence).  The end of the stent comes out of your urethra.  You cannot urinate.  You have sudden, sharp, or severe pain in your abdomen or lower back.  You have a fever.  You have swelling or pain in your legs.  You have difficulty breathing. Summary  After the procedure, it is common to have mild pain when you urinate that goes away within a   few minutes after you urinate. This may last for up to 1 week.  Watch for any blood in your urine. Call your health care provider if the amount of blood in your urine increases.  Take over-the-counter and prescription medicines only as told by your health care provider.  Drink enough fluid to keep your urine pale yellow. This information is not intended to replace advice given to you by your health care provider. Make sure you discuss any questions you have with your health care provider. Document Revised: 04/23/2018 Document Reviewed: 04/24/2018 Elsevier  Patient Education  [place] Instructions  Activity: Get plenty of rest for the remainder of the day. A responsible individual must stay with you for 24 hours following the procedure.  For the next 24 hours, DO NOT: -Drive a car -Paediatric nurse -Drink alcoholic beverages -Take any medication unless instructed by your physician -Make any legal decisions or sign important papers.  Meals: Start with liquid foods such as gelatin or soup. Progress to regular foods as tolerated. Avoid greasy, spicy, heavy foods. If nausea and/or vomiting occur, drink only clear liquids until the nausea and/or vomiting subsides. Call your physician if vomiting continues.  Special Instructions/Symptoms: Your throat may feel dry or sore from the anesthesia or the breathing tube placed in your throat during surgery. If this causes discomfort, gargle with warm salt water. The discomfort should disappear within 24 hours.    No additional Tylenol/acetaminophen until after 12:45 pm today if needed.

## 2020-03-25 ENCOUNTER — Encounter (HOSPITAL_BASED_OUTPATIENT_CLINIC_OR_DEPARTMENT_OTHER): Payer: Self-pay | Admitting: Urology

## 2020-03-25 NOTE — Op Note (Signed)
NAME: Robert Oconnell, [place] MEDICAL RECORD ES:[id number] ACCOUNT [id number] DATE OF BIRTH:[birth year] FACILITY: [facility] LOCATION: [facility] PHYSICIAN:Lakeithia Rasor Tresa Moore, MD  OPERATIVE REPORT  DATE OF PROCEDURE:  03/24/2020  SURGEON:  [name], MD  PREOPERATIVE DIAGNOSIS:  Right ureteral and bilateral renal stones.    POSTOPERATIVE DIAGNOSIS:  Bilateral ureteral and renal stones.  PROCEDURES: 1.  Cystoscopy, bilateral retrograde pyelograms, interpretation. 2.  Bilateral ureteroscopy with laser lithotripsy, first-stage. 3.  Insertion of bilateral ureteral stents, 5.0 x 26 Polaris, no tether.  ESTIMATED BLOOD LOSS:  Nil.  COMPLICATIONS:  None.  SPECIMENS:  Bilateral ureteral and renal stone fragments for composition analysis.  FINDINGS: 1.  Large median lobe of the prostate as anticipated. 2.  Bilateral distal ureteral stones with mild hydronephrosis. 3.  Complete resolution of all accessible stone fragments larger than one-third mm following laser basket extraction on the right side. 4.  Successful placement of right ureteral stent. 5.  Removal of estimated volume of 60% of left-sided renal stones and ureteral stones. 6.  Successful placement of left ureteral stent, proximal end in renal pelvis, distal end in urinary bladder.  INDICATIONS:  The patient is a pleasant 74 year old man with longstanding history of recurrent urolithiasis.  He was found on workup for colicky flank pain to have a large right distal ureteral stone approximately 1 cm, several small right renal stones  and very large volume of left renal stones.  Options were discussed for management and he wished to proceed with bilateral ureteroscopy with goal of stone free.  Given his large volume stone on the left side, we had discussed previously that a staged  approach may be warranted.  He also mentioned yesterday he has also been getting some left-sided flank pain as well.  Informed consent was obtained and placed in the  medical record.  DESCRIPTION OF PROCEDURE:  The patient being identified, the procedure being bilateral ureteroscopic stone manipulation was confirmed.  Procedure timeout was performed.  IV antibiotics administered.  General anesthesia induced.  The patient was placed  into a low lithotomy position.  Sterile field was created, prepped and draped base of the penis, perineum and proximal thighs using iodine.  Cystourethroscopy was then performed using 21-French rigid cystoscope with offset lens.  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  The right ureteral orifice was cannulated with a 6-French renal catheter and a right retrograde pyelogram was obtained.  Right retrograde pyelogram demonstrated a single right ureter, single system right kidney.  There was a filling defect in distal ureter consistent with known stone.  There was mild hydronephrosis above this.  A 0.03 ZIPwire was advanced to lower pole and  set aside as a safety wire.  Next, left retrograde pyelogram was obtained.  Left retrograde pyelogram demonstrated a single left ureter with single system left kidney.  No obvious filling defects or narrowing noted at this time.  A separate 0.03 ZIPwire was advanced to lower pole and set aside as safety wire.  An 8-French  feeding tube was placed in the urinary bladder for pressure release.  Semirigid ureteroscopy was then performed of the distal left ureter alongside a separate sensor working wire.  Unexpectedly, there was a left distal ureteral stone that was noted.   This was clearly new and represented antegrade movement of the prior renal stone.  This was much too large for simple basketing.  It was free floating.  Therefore, it was retrograde positioned via the semirigid scope to the  level of the most proximal  ureter.  Next, semirigid ureteroscopy was performed of the distal right ureter alongside a separate sensor working wire.   In the distal ureter as expected, there was a very large ovoid stone approximately 1 cm.  It was moderately impacted.  It was much  too large for simple basketing.  As such, holmium laser energy applied 70 setting of 0.2 joules and 20 Hz, and using dusting technique, approximately 80% of the stone was removed, 20% fragmented, the fragments being grasped, brought to the level of the  urinary bladder with an Escape basket and semirigid ureteroscopy performed of the proximal right ureter, having dissected the distal four-fifths.  The semirigid scope was exchanged for a 12/14, 35 cm access sheath to the level of the proximal ureter and  flexible digital ureteroscopy performed of the proximal right ureter.  Systematic inspection of the right kidney, including all calices x3.  There were several small papillary tip calcifications that were amenable to simple basketing.  They were removed  and set aside for composition analysis.  Access sheath was removed under continuous vision.  No significant mucosal abnormalities were found, other than some mild edema at the site of prior stone impaction.  Next, the access sheath was placed over the  left sensor working wire to the level of the left proximal ureter.  Using continuous fluoroscopic guidance, flexible digital ureteroscopy was performed of the proximal left ureter with systematic inspection of the left kidney.  There were multifocal  renal calcifications in the left side upper pole corresponding to the previously retrograde positioned ureteral stone, mid pole, between a small papillary tip and a very large volume lower pole stone, estimated to be at least 1.5 to approximately 2 cm.   Holmium laser energy then applied to stones again using settings of 0.2 joules and 20 Hz.  The upper pole pieces were dusted and ablated and fragments approximately 1-2 mm.  Lower pole holmium laser energy was applied to the stone for approximately 70  minutes.  This resulted in  estimated ablation of approximately 60% of the stone volume.  At this point, there was inherently so much stone dust and debris that visualization was quite poor and clearly it would be safest to proceed with a staged approach.   As such, the access sheath was removed under continuous vision.  No significant mucosal abnormalities were found and a new 5 x 26 Polaris-type stent was placed over the remaining safety wire on the left side.  Using fluoroscopic guidance good proximal  and distal planes were noted.  A separate 5 x 26 Polaris-type stent was placed over the right safety wire.  Using fluoroscopic guidance, good pulses planes were noted.  Anesthesia terminated.  The patient tolerated the procedure well.  No immediate  complications.  The patient was taken to postanesthesia care in stable condition with plan for discharge home after void.  He will be set up for a second-stage procedure in approximately 3 weeks.  VN/NUANCE  D:03/24/2020 T:03/24/2020 JOB:[id number]

## 2020-03-30 ENCOUNTER — Other Ambulatory Visit: Payer: Self-pay | Admitting: Urology

## 2020-04-07 ENCOUNTER — Encounter (HOSPITAL_BASED_OUTPATIENT_CLINIC_OR_DEPARTMENT_OTHER): Payer: Self-pay | Admitting: Urology

## 2020-04-08 ENCOUNTER — Encounter (HOSPITAL_BASED_OUTPATIENT_CLINIC_OR_DEPARTMENT_OTHER): Payer: Self-pay | Admitting: Urology

## 2020-04-08 ENCOUNTER — Other Ambulatory Visit: Payer: Self-pay

## 2020-04-08 DIAGNOSIS — N201 Calculus of ureter: Secondary | ICD-10-CM | POA: Diagnosis not present

## 2020-04-08 NOTE — Progress Notes (Addendum)
Spoke w/ via phone for pre-op interview---pt wife Pamala Hurry Lab needs dos----  I stat 8( Gent)           Lab results------none COVID test ------04-10-2020 1005 Arrive at -------1230 04-14-2020 NPO after MN NO Solid Food.  Clear liquids from MN until---1130 am then npo Medications to take morning of surgery -----none Diabetic medication -----n/a Patient Special Instructions -----none Pre-Op special Istructions -----none Patient verbalized understanding of instructions that were given at this phone interview. Patient denies shortness of breath, chest pain, fever, cough at this phone interview.

## 2020-04-10 ENCOUNTER — Other Ambulatory Visit (HOSPITAL_COMMUNITY)
Admission: RE | Admit: 2020-04-10 | Discharge: 2020-04-10 | Disposition: A | Discharge status: Home / Self Care | Payer: Medicare PPO | Source: Ambulatory Visit | Attending: Urology | Admitting: Urology

## 2020-04-10 DIAGNOSIS — Z20822 Contact with and (suspected) exposure to covid-19: Secondary | ICD-10-CM | POA: Insufficient documentation

## 2020-04-10 DIAGNOSIS — Z01818 Encounter for other preprocedural examination: Secondary | ICD-10-CM | POA: Diagnosis not present

## 2020-04-10 LAB — SARS CORONAVIRUS 2 (TAT 6-24 HRS): SARS Coronavirus 2: NEGATIVE

## 2020-04-14 ENCOUNTER — Ambulatory Visit (HOSPITAL_BASED_OUTPATIENT_CLINIC_OR_DEPARTMENT_OTHER)
Admission: RE | Admit: 2020-04-14 | Discharge: 2020-04-14 | Disposition: A | Discharge status: Home / Self Care | Payer: Medicare PPO | Attending: Urology | Admitting: Urology

## 2020-04-14 ENCOUNTER — Ambulatory Visit (HOSPITAL_BASED_OUTPATIENT_CLINIC_OR_DEPARTMENT_OTHER): Payer: Medicare PPO | Admitting: Certified Registered"

## 2020-04-14 ENCOUNTER — Encounter (HOSPITAL_BASED_OUTPATIENT_CLINIC_OR_DEPARTMENT_OTHER)
Admission: RE | Disposition: A | Discharge status: Home / Self Care | Payer: Self-pay | Source: Home / Self Care | Attending: Urology

## 2020-04-14 ENCOUNTER — Encounter (HOSPITAL_BASED_OUTPATIENT_CLINIC_OR_DEPARTMENT_OTHER): Payer: Self-pay | Admitting: Urology

## 2020-04-14 DIAGNOSIS — K219 Gastro-esophageal reflux disease without esophagitis: Secondary | ICD-10-CM | POA: Diagnosis not present

## 2020-04-14 DIAGNOSIS — Z79899 Other long term (current) drug therapy: Secondary | ICD-10-CM | POA: Insufficient documentation

## 2020-04-14 DIAGNOSIS — G250 Essential tremor: Secondary | ICD-10-CM | POA: Insufficient documentation

## 2020-04-14 DIAGNOSIS — Z9841 Cataract extraction status, right eye: Secondary | ICD-10-CM | POA: Diagnosis not present

## 2020-04-14 DIAGNOSIS — Z791 Long term (current) use of non-steroidal anti-inflammatories (NSAID): Secondary | ICD-10-CM | POA: Insufficient documentation

## 2020-04-14 DIAGNOSIS — I10 Essential (primary) hypertension: Secondary | ICD-10-CM | POA: Diagnosis not present

## 2020-04-14 DIAGNOSIS — Z6831 Body mass index (BMI) 31.0-31.9, adult: Secondary | ICD-10-CM | POA: Diagnosis not present

## 2020-04-14 DIAGNOSIS — N132 Hydronephrosis with renal and ureteral calculous obstruction: Secondary | ICD-10-CM | POA: Diagnosis not present

## 2020-04-14 DIAGNOSIS — Z87442 Personal history of urinary calculi: Secondary | ICD-10-CM | POA: Diagnosis not present

## 2020-04-14 DIAGNOSIS — Z888 Allergy status to other drugs, medicaments and biological substances status: Secondary | ICD-10-CM | POA: Diagnosis not present

## 2020-04-14 DIAGNOSIS — J45909 Unspecified asthma, uncomplicated: Secondary | ICD-10-CM | POA: Insufficient documentation

## 2020-04-14 DIAGNOSIS — E669 Obesity, unspecified: Secondary | ICD-10-CM | POA: Diagnosis not present

## 2020-04-14 DIAGNOSIS — Z809 Family history of malignant neoplasm, unspecified: Secondary | ICD-10-CM | POA: Diagnosis not present

## 2020-04-14 DIAGNOSIS — Z466 Encounter for fitting and adjustment of urinary device: Secondary | ICD-10-CM | POA: Diagnosis not present

## 2020-04-14 DIAGNOSIS — G4733 Obstructive sleep apnea (adult) (pediatric): Secondary | ICD-10-CM | POA: Insufficient documentation

## 2020-04-14 DIAGNOSIS — E785 Hyperlipidemia, unspecified: Secondary | ICD-10-CM | POA: Diagnosis not present

## 2020-04-14 DIAGNOSIS — Z961 Presence of intraocular lens: Secondary | ICD-10-CM | POA: Diagnosis not present

## 2020-04-14 DIAGNOSIS — Z9842 Cataract extraction status, left eye: Secondary | ICD-10-CM | POA: Diagnosis not present

## 2020-04-14 DIAGNOSIS — G4730 Sleep apnea, unspecified: Secondary | ICD-10-CM | POA: Diagnosis not present

## 2020-04-14 DIAGNOSIS — N200 Calculus of kidney: Secondary | ICD-10-CM | POA: Diagnosis present

## 2020-04-14 DIAGNOSIS — N202 Calculus of kidney with calculus of ureter: Secondary | ICD-10-CM | POA: Diagnosis not present

## 2020-04-14 HISTORY — PX: CYSTOSCOPY W/ URETERAL STENT REMOVAL: SHX1430

## 2020-04-14 HISTORY — PX: CYSTOSCOPY WITH RETROGRADE PYELOGRAM, URETEROSCOPY AND STENT PLACEMENT: SHX5789

## 2020-04-14 LAB — POCT I-STAT, CHEM 8
BUN: 23 mg/dL (ref 8–23)
Calcium, Ion: 1.14 mmol/L — ABNORMAL LOW (ref 1.15–1.40)
Chloride: 106 mmol/L (ref 98–111)
Creatinine, Ser: 1.2 mg/dL (ref 0.61–1.24)
Glucose, Bld: 97 mg/dL (ref 70–99)
HCT: 47 % (ref 39.0–52.0)
Hemoglobin: 16 g/dL (ref 13.0–17.0)
Potassium: 4.3 mmol/L (ref 3.5–5.1)
Sodium: 142 mmol/L (ref 135–145)
TCO2: 25 mmol/L (ref 22–32)

## 2020-04-14 SURGERY — CYSTOURETEROSCOPY, WITH RETROGRADE PYELOGRAM AND STENT INSERTION
Anesthesia: General | Laterality: Right

## 2020-04-14 MED ORDER — DEXAMETHASONE SODIUM PHOSPHATE 4 MG/ML IJ SOLN
INTRAMUSCULAR | Status: DC | PRN
  Administered 2020-04-14: 10 mg via INTRAVENOUS

## 2020-04-14 MED ORDER — LIDOCAINE HCL (CARDIAC) PF 100 MG/5ML IV SOSY
PREFILLED_SYRINGE | INTRAVENOUS | Status: DC | PRN
  Administered 2020-04-14: 100 mg via INTRAVENOUS

## 2020-04-14 MED ORDER — OXYCODONE-ACETAMINOPHEN 5-325 MG PO TABS: 1.0000 | ORAL_TABLET | Freq: Four times a day (QID) | ORAL | 0 refills | Status: DC | PRN

## 2020-04-14 MED ORDER — FENTANYL CITRATE (PF) 100 MCG/2ML IJ SOLN: 25.0000 ug | INTRAMUSCULAR | Status: DC | PRN

## 2020-04-14 MED ORDER — PROPOFOL 10 MG/ML IV BOLUS
INTRAVENOUS | Status: DC | PRN
  Administered 2020-04-14: 50 mg via INTRAVENOUS
  Administered 2020-04-14: 200 mg via INTRAVENOUS

## 2020-04-14 MED ORDER — ONDANSETRON HCL 4 MG/2ML IJ SOLN: 4.0000 mg | Freq: Once | INTRAMUSCULAR | Status: DC | PRN

## 2020-04-14 MED ORDER — GENTAMICIN SULFATE 40 MG/ML IJ SOLN
5.0000 mg/kg | INTRAVENOUS | Status: DC
  Filled 2020-04-14: qty 11.75

## 2020-04-14 MED ORDER — FENTANYL CITRATE (PF) 100 MCG/2ML IJ SOLN
INTRAMUSCULAR | Status: AC
  Filled 2020-04-14: qty 2

## 2020-04-14 MED ORDER — FENTANYL CITRATE (PF) 100 MCG/2ML IJ SOLN
INTRAMUSCULAR | Status: DC | PRN
Start: 2020-04-14 — End: 2020-04-14
  Administered 2020-04-14: 50 ug via INTRAVENOUS
  Administered 2020-04-14 (×4): 25 ug via INTRAVENOUS
  Administered 2020-04-14: 50 ug via INTRAVENOUS

## 2020-04-14 MED ORDER — CEPHALEXIN 500 MG PO CAPS: 500.0000 mg | ORAL_CAPSULE | Freq: Two times a day (BID) | ORAL | 0 refills | Status: DC

## 2020-04-14 MED ORDER — KETOROLAC TROMETHAMINE 10 MG PO TABS: 10.0000 mg | ORAL_TABLET | Freq: Three times a day (TID) | ORAL | 0 refills | Status: DC | PRN

## 2020-04-14 MED ORDER — IOHEXOL 300 MG/ML  SOLN
INTRAMUSCULAR | Status: DC | PRN
  Administered 2020-04-14: 18 mL via URETHRAL

## 2020-04-14 MED ORDER — DEXAMETHASONE SODIUM PHOSPHATE 10 MG/ML IJ SOLN
INTRAMUSCULAR | Status: AC
  Filled 2020-04-14: qty 1

## 2020-04-14 MED ORDER — ONDANSETRON HCL 4 MG/2ML IJ SOLN
INTRAMUSCULAR | Status: AC
  Filled 2020-04-14: qty 2

## 2020-04-14 MED ORDER — ONDANSETRON HCL 4 MG/2ML IJ SOLN
INTRAMUSCULAR | Status: DC | PRN
  Administered 2020-04-14: 4 mg via INTRAVENOUS

## 2020-04-14 MED ORDER — GENTAMICIN SULFATE 40 MG/ML IJ SOLN
5.0000 mg/kg | INTRAVENOUS | Status: AC
  Administered 2020-04-14: 383.6 mg via INTRAVENOUS
  Filled 2020-04-14: qty 9.5

## 2020-04-14 MED ORDER — LACTATED RINGERS IV SOLN: INTRAVENOUS | Status: DC

## 2020-04-14 MED ORDER — LIDOCAINE 2% (20 MG/ML) 5 ML SYRINGE
INTRAMUSCULAR | Status: AC
  Filled 2020-04-14: qty 5

## 2020-04-14 SURGICAL SUPPLY — 27 items
BAG DRAIN URO-CYSTO SKYTR STRL (DRAIN) ×4 IMPLANT
BAG DRN UROCATH (DRAIN) ×2
BASKET LASER NITINOL 1.9FR (BASKET) ×4 IMPLANT
BSKT STON RTRVL 120 1.9FR (BASKET) ×2
CATH INTERMIT  6FR 70CM (CATHETERS) IMPLANT
CLOTH BEACON ORANGE TIMEOUT ST (SAFETY) ×4 IMPLANT
DRSG TEGADERM 2-3/8X2-3/4 SM (GAUZE/BANDAGES/DRESSINGS) ×4 IMPLANT
FIBER LASER FLEXIVA 365 (UROLOGICAL SUPPLIES) IMPLANT
FIBER LASER TRAC TIP (UROLOGICAL SUPPLIES) ×4 IMPLANT
GLOVE BIO SURGEON STRL SZ7.5 (GLOVE) ×4 IMPLANT
GOWN STRL REUS W/TWL LRG LVL3 (GOWN DISPOSABLE) ×4 IMPLANT
GUIDEWIRE ANG ZIPWIRE 038X150 (WIRE) ×8 IMPLANT
GUIDEWIRE STR DUAL SENSOR (WIRE) ×8 IMPLANT
IV NS 1000ML (IV SOLUTION) ×4
IV NS 1000ML BAXH (IV SOLUTION) ×2 IMPLANT
IV NS IRRIG 3000ML ARTHROMATIC (IV SOLUTION) ×8 IMPLANT
KIT TURNOVER CYSTO (KITS) ×4 IMPLANT
MANIFOLD NEPTUNE II (INSTRUMENTS) ×4 IMPLANT
NS IRRIG 500ML POUR BTL (IV SOLUTION) ×4 IMPLANT
PACK CYSTO (CUSTOM PROCEDURE TRAY) ×4 IMPLANT
SHEATH URET ACCESS 12FR/35CM (UROLOGICAL SUPPLIES) ×4 IMPLANT
STENT POLARIS 5FRX26 (STENTS) ×4 IMPLANT
SYR 10ML LL (SYRINGE) ×4 IMPLANT
TUBE CONNECTING 12'X1/4 (SUCTIONS) ×1
TUBE CONNECTING 12X1/4 (SUCTIONS) ×3 IMPLANT
TUBE FEEDING 8FR 16IN STR KANG (MISCELLANEOUS) IMPLANT
TUBING UROLOGY SET (TUBING) ×4 IMPLANT

## 2020-04-14 NOTE — Anesthesia Preprocedure Evaluation (Signed)
Anesthesia Evaluation  Patient identified by MRN, date of birth, ID band Patient awake    Reviewed: Allergy & Precautions, NPO status , Patient's Chart, lab work & pertinent test results  History of Anesthesia Complications Negative for: history of anesthetic complications  Airway Mallampati: III  TM Distance: >3 FB Neck ROM: Full    Dental no notable dental hx.    Pulmonary sleep apnea ,    Pulmonary exam normal        Cardiovascular Exercise Tolerance: Good negative cardio ROS Normal cardiovascular exam     Neuro/Psych negative neurological ROS  negative psych ROS   GI/Hepatic negative GI ROS, Neg liver ROS,   Endo/Other  BMI 35  Renal/GU Renal stones  negative genitourinary   Musculoskeletal negative musculoskeletal ROS (+)   Abdominal   Peds  Hematology negative hematology ROS (+)   Anesthesia Other Findings Day of surgery medications reviewed with patient.  Reproductive/Obstetrics negative OB ROS                             Anesthesia Physical  Anesthesia Plan  ASA: II  Anesthesia Plan: General   Post-op Pain Management:    Induction: Intravenous  PONV Risk Score and Plan: 2 and Treatment may vary due to age or medical condition, Ondansetron and Dexamethasone  Airway Management Planned: LMA  Additional Equipment: None  Intra-op Plan:   Post-operative Plan: Extubation in OR  Informed Consent: I have reviewed the patients History and Physical, chart, labs and discussed the procedure including the risks, benefits and alternatives for the proposed anesthesia with the patient or authorized representative who has indicated his/her understanding and acceptance.     Dental advisory given  Plan Discussed with: CRNA, Anesthesiologist and Surgeon  Anesthesia Plan Comments:         Anesthesia Quick Evaluation

## 2020-04-14 NOTE — Transfer of Care (Signed)
Immediate Anesthesia Transfer of Care Note  Patient: Robert Oconnell.  Procedure(s) Performed: Procedure(s) (LRB): CYSTOSCOPY WITH RETROGRADE PYELOGRAM, URETEROSCOPY AND STENT EXCHANGE STONE BASKET EXTRACTION (Left) CYSTOSCOPY WITH STENT REMOVAL (Right)  Patient Location: PACU  Anesthesia Type: General  Level of Consciousness: awake, sedated, patient cooperative and responds to stimulation  Airway & Oxygen Therapy: Patient Spontanous Breathing and Patient connected to Erskine 02 and soft FM   Post-op Assessment: Report given to PACU RN, Post -op Vital signs reviewed and stable and Patient moving all extremities  Post vital signs: Reviewed and stable  Complications: No apparent anesthesia complications

## 2020-04-14 NOTE — Discharge Instructions (Signed)
1 - You may have urinary urgency (bladder spasms) and bloody urine on / off with stent in place. This is normal.  2 - Remove tethered stent on Friday morning at home by pulling on string, then blue-white plastic tubing, and discarding. Office is open Friday if any issues arise.   3 - Call MD or go to ER for fever >102, severe pain / nausea / vomiting not relieved by medications, or acute change in medical status  Post Anesthesia Home Care Instructions  Activity: Get plenty of rest for the remainder of the day. A responsible individual must stay with you for 24 hours following the procedure.  For the next 24 hours, DO NOT: -Drive a car -Paediatric nurse -Drink alcoholic beverages -Take any medication unless instructed by your physician -Make any legal decisions or sign important papers.  Meals: Start with liquid foods such as gelatin or soup. Progress to regular foods as tolerated. Avoid greasy, spicy, heavy foods. If nausea and/or vomiting occur, drink only clear liquids until the nausea and/or vomiting subsides. Call your physician if vomiting continues.  Special Instructions/Symptoms: Your throat may feel dry or sore from the anesthesia or the breathing tube placed in your throat during surgery. If this causes discomfort, gargle with warm salt water. The discomfort should disappear within 24 hours.  [facility] [phone] Post Ureteroscopy With Stent Instructions  Definitions:  Ureter: The duct that transports urine from the kidney to the bladder. Stent:   A plastic hollow tube that is placed into the ureter, from the kidney to the bladder to prevent the ureter from swelling shut.  GENERAL INSTRUCTIONS:  Despite the fact that no skin incisions were used, the area around the ureter and bladder is raw and irritated. The stent is a foreign body which will further irritate the bladder wall. This irritation is manifested by increased frequency of urination, both day  and night, and by an increase in the urge to urinate. In some, the urge to urinate is present almost always. Sometimes the urge is strong enough that you may not be able to stop yourself from urinating. The only real cure is to remove the stent and then give time for the bladder wall to heal which can't be done until the danger of the ureter swelling shut has passed, which varies.  You may see some blood in your urine while the stent is in place and a few days afterwards. Do not be alarmed, even if the urine was clear for a while. Get off your feet and drink lots of fluids until clearing occurs. If you start to pass clots or don't improve, call us.  DIET: You may return to your normal diet immediately. Because of the raw surface of your bladder, alcohol, spicy foods, acid type foods and drinks with caffeine may cause irritation or frequency and should be used in moderation. To keep your urine flowing freely and to avoid constipation, drink plenty of fluids during the day ( 8-10 glasses ). Tip: Avoid cranberry juice because it is very acidic.  ACTIVITY: Your physical activity doesn't need to be restricted. However, if you are very active, you may see some blood in your urine. We suggest that you reduce your activity under these circumstances until the bleeding has stopped.  BOWELS: It is important to keep your bowels regular during the postoperative period. Straining with bowel movements can cause bleeding. A bowel movement every other day is reasonable. Use a mild laxative if needed, such as Milk  of Magnesia 2-3 tablespoons, or 2 Dulcolax tablets. Call if you continue to have problems. If you have been taking narcotics for pain, before, during or after your surgery, you may be constipated. Take a laxative if necessary.   MEDICATION: You should resume your pre-surgery medications unless told not to. In addition you will often be given an antibiotic to prevent infection. These should be taken as  prescribed until the bottles are finished unless you are having an unusual reaction to one of the drugs.  PROBLEMS YOU SHOULD REPORT TO [place]:  Fevers over 100.5 Fahrenheit.  Heavy bleeding, or clots ( See above notes about blood in urine ).  Inability to urinate.  Drug reactions ( hives, rash, nausea, vomiting, diarrhea ).  Severe burning or pain with urination that is not improving.  FOLLOW-UP: You will need a follow-up appointment to monitor your progress. Call for this appointment at the number listed above. Usually the first appointment will be about three to fourteen days after your surgery.

## 2020-04-14 NOTE — Anesthesia Procedure Notes (Signed)
Procedure Name: LMA Insertion Date/Time: 04/14/2020 3:09 PM Performed by: Justice Rocher, CRNA Pre-anesthesia Checklist: Patient identified, Emergency Drugs available, Suction available, Patient being monitored and Timeout performed Patient Re-evaluated:Patient Re-evaluated prior to induction Oxygen Delivery Method: Circle system utilized Preoxygenation: Pre-oxygenation with 100% oxygen Induction Type: IV induction Ventilation: Mask ventilation without difficulty LMA: LMA inserted LMA Size: 5.0 Number of attempts: 1 Airway Equipment and Method: Bite block Placement Confirmation: positive ETCO2,  breath sounds checked- equal and bilateral and CO2 detector Tube secured with: Tape Dental Injury: Teeth and Oropharynx as per pre-operative assessment

## 2020-04-14 NOTE — Op Note (Signed)
NAME: Robert Oconnell, Robert Oconnell MEDICAL RECORD ZO:[id number] ACCOUNT [id number] DATE OF BIRTH:1946/03/02 FACILITY: [facility] LOCATION: [facility] PHYSICIAN:Ave Scharnhorst Tresa Moore, MD  OPERATIVE REPORT  DATE OF PROCEDURE:  04/14/2020  SURGEON:  [name], MD  PREOPERATIVE DIAGNOSIS:  Residual left ureteral and renal stone, status post first-stage bilateral ureteroscopy.  POSTOPERATIVE DIAGNOSIS:  Residual left ureteral and renal stone, status post first-stage bilateral ureteroscopy.  PROCEDURE: 1.  Cystoscopy with right stent pull. 2.  Left ureteroscopy with second-stage basketing of stones. 3.  Exchange of left ureteral stent, 5 x 26 Polaris with tether.  ESTIMATED BLOOD LOSS:  Nil.  COMPLICATIONS:  None.  SPECIMENS:  Left ureteral and renal stone fragments for composition analysis.  FINDINGS: 1.  Left ureteral and renal stone residual fragments, total volume approximately 8 mm2. 2.  Complete resolution of all accessible stone fragments within the left kidney and ureter following basket extraction. 3.  Successful replacement of left ureteral stent, proximal end in renal pelvis, distal end in urinary bladder.  INDICATIONS:  The  patient is a pleasant 74 year old man with history of recurrent urolithiasis.  He was found on workup for colicky flank pain to have bilateral renal stones.  Options were discussed for management.  He wished to proceed with bilateral  ureteroscopy with goal of stone free.  He underwent first stage procedure and on 03/22/2020 where he was found to actually have bilateral ureteral stones and large volume renal stones.  At that time, his right-sided stone burden was completely addressed  and his left-sided ureteral stone, as well as at least 50% of his left renal stone.  Given the large volume left renal stones, it was felt a staged approach would be warranted for the goal of stone free and he presents for second stage procedure today  with left ureteroscopy and right stent  pull.  Informed consent was obtained and placed in the medical record.  DESCRIPTION OF PROCEDURE:  The patient being identified, the procedure being right stent pull, left second-stage ureteroscopy was confirmed.  Procedure timeout was performed.  Intravenous antibiotics administered.  General anesthesia induced.  The  patient was placed into a low lithotomy position.  Sterile field was created, prepped and draped base of the penis, perineum and proximal thighs using iodine.  Cystourethroscopy was then performed using 21-French rigid cystoscope with offset lens.   Inspection of the anterior and posterior urethra revealed trilobar prostatic hypertrophy as per prior.  Inspection of urinary bladder revealed distal end of bilateral ureteral stents.  There was minimal encrustation.  The distal end of the right stent  was grasped.  It was brought out in its entirety, inspected and intact and set aside for discard.  The distal end of the left stent was grasped, brought to the level of the urethral meatus.  A 0.03 ZIPwire was advanced to the level of the upper pole and  left stent was exchanged for an open-ended catheter and left retrograde pyelogram was obtained.  Left retrograde pyelogram demonstrated a single left ureter, single system left kidney.  The open-ended catheter was exchanged once again with the ZIPwire to the level of the upper pole acting as a safety wire.  An 8-French feeding tube was placed in the  urinary bladder for pressure release, and semirigid ureteroscopy performed of the distal four-fifths of left ureter alongside a separate Sensor working wire.  There were several small ureteral stone fragments that were retrograde positioned to the level  of the kidney, otherwise unremarkable.  The semirigid scope was exchanged for  a 12/14 medium length ureteral access sheath at the level of the proximal ureter using continuous fluoroscopic guidance, taking care not to pass the sheath proximal to the   visualized segment.  A flexible digital ureteroscopy performed of the proximal left ureter, systematic inspection of the kidney and all calices x3.  There was multifocal small volume stone mostly in the lower pole at the acute angle of the calix.  These  were fortunately amenable to simple basketing and an Escape basket was used to grasp these fragments.  They were removed and set aside for analysis.  There was some residual small stone fragments in the upper pole that were similarly managed.  Following  this, complete resolution of all accessible stone fragments larger than one-third mm with excellent hemostasis.  No evidence of renal perforation.  The access sheath was removed under continuous vision.  No significant mucosal abnormalities were found.   Given access sheath usage, it was felt that brief interval stenting with a tethered stent would be warranted.  As such, a new 5 x 26 Polaris-type stent was placed with remaining safety wire using fluoroscopic guidance.  Good proximal and distal planes  were noted.  Tether was left in place and fashioned to the dorsum of the penis and the procedure was terminated.  The patient tolerated the procedure well.  There were no immediate perioperative complications.  The patient was taken to the postanesthesia  care unit in stable condition.  Plan for discharge home.  VN/NUANCE  D:04/14/2020 T:04/14/2020 JOB:[id number]

## 2020-04-14 NOTE — Anesthesia Postprocedure Evaluation (Signed)
Anesthesia Post Note  Patient: Robert Oconnell.  Procedure(s) Performed: CYSTOSCOPY WITH RETROGRADE PYELOGRAM, URETEROSCOPY AND STENT EXCHANGE STONE BASKET EXTRACTION (Left ) CYSTOSCOPY WITH STENT REMOVAL (Right )     Patient location during evaluation: PACU Anesthesia Type: General Level of consciousness: awake and alert, oriented and patient cooperative Pain management: pain level controlled Vital Signs Assessment: post-procedure vital signs reviewed and stable Respiratory status: spontaneous breathing, nonlabored ventilation and respiratory function stable Cardiovascular status: blood pressure returned to baseline and stable Postop Assessment: no apparent nausea or vomiting Anesthetic complications: no   No complications documented.  Last Vitals:  Vitals:   04/14/20 1250 04/14/20 1608  BP: (!) 143/82 (!) 136/101  Pulse: 91 72  Resp: 18 20  Temp: 36.9 C 36.5 C  SpO2: 97% 99%    Last Pain:  Vitals:   04/14/20 1608  TempSrc:   PainSc: 0-No pain                 Pervis Hocking

## 2020-04-14 NOTE — Brief Op Note (Signed)
04/14/2020  3:52 PM  PATIENT:  Robert Oconnell.  74 y.o. male  PRE-OPERATIVE DIAGNOSIS:  RESIDUAL LEFT RENAL STONE  POST-OPERATIVE DIAGNOSIS:  RESIDUAL LEFT RENAL STONE  PROCEDURE:  Procedure(s) with comments: CYSTOSCOPY WITH RETROGRADE PYELOGRAM, URETEROSCOPY AND STENT EXCHANGE STONE BASKET EXTRACTION (Left) - 1 HR CYSTOSCOPY WITH STENT REMOVAL (Right)  SURGEON:  Surgeon(s) and Role:    * [name], MD - Primary  PHYSICIAN ASSISTANT:   ASSISTANTS: none   ANESTHESIA:   general  EBL:  0 mL   BLOOD ADMINISTERED:none  DRAINS: none   LOCAL MEDICATIONS USED:  NONE  SPECIMEN:  Source of Specimen:  Left renal / ureteral stone fragments  DISPOSITION OF SPECIMEN:  Given to patient  COUNTS:  YES  TOURNIQUET:  * No tourniquets in log *  DICTATION: .Other Dictation: Dictation Number [id number]  PLAN OF CARE: Discharge to home after PACU  PATIENT DISPOSITION:  PACU - hemodynamically stable.   Delay start of Pharmacological VTE agent (>24hrs) due to surgical blood loss or risk of bleeding: yes

## 2020-04-14 NOTE — H&P (Signed)
Robert Oconnell. is an 74 y.o. male.    Chief Complaint: PRe-OP right stent pull, Left 2nd stage ureteroscopic stone manipulation.   HPI:    1 - Recurrent Nephroilithiasis -  2015- unilateral SWL, composition 100% CaOx  12/2017 - KUB/RUS Rt 54mm mid, Left lower pole 25mm x 3 stones, ? left hydro v. peripelvic cysts (CT confirmed cysts only)  03/2019 - KUB/RUS about 5-10% incrase stone volume bilateral and stable left peripelvic cysts  02/2020 - 1cm Rt distal ureteral stone group (smal renal as well, minimal hydro), about 66mm left renal stone burden, non-obstructin by CT.   2 - Lower Urinary Tract Symptoms - on tamsulosin at [facility] with good control of mild LUTS. Prostate Vol 148mL with median lobe by CT ellipsoid calculation 2019.   3 - Left Renal Peripelvic Cysts v. Hydro renal US 06/2017 and 12/2017 with ? left hydro v. peri-pelvic cysts. Dedicated contrast CT 01/2018 confirms only non-complex peri-pelvic cysts, no hydro.     PMH sig for HTN, mild obesity. NO ischmic CV diseas / blood thinners. 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 His PCP is Lovette Cliche MD with [facility] in [place].   Today "Robert Oconnell" is seen to proceed with right stent pull, left 2nd stage ureteroscopy. He had 1st stage procedure focusing on right side on 03/22/20.   Past Medical History:  Diagnosis Date  . Eosinophilic esophagitis 89/3810   dx thru EGD w/ bx by dr Lyndel Safe;  followed by allergy / asthma clinic last note in epic  . Hematuria    since 04-05-2020 urine tinged red  . History of gastroesophageal reflux (GERD)   . History of kidney stones   . Hyperlipidemia   . Lower back pain    04-02-2020  . Mild memory disturbance    neurologist--- dr Jannifer Franklin  . Nocturia   . OSA (obstructive sleep apnea)    per pt  used cpap until he lost weight, stated no longer needed it,  last used approx. 2013  . Renal calculus, left   . 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 Urgency of  urination     Past Surgical History:  Procedure Laterality Date  . CATARACT EXTRACTION W/ INTRAOCULAR LENS  IMPLANT, BILATERAL  2010  . CYSTOSCOPY WITH RETROGRADE PYELOGRAM, URETEROSCOPY AND STENT PLACEMENT Bilateral 03/24/2020   Procedure: CYSTOSCOPY WITH RETROGRADE PYELOGRAM, URETEROSCOPY AND STENT PLACEMENT STONE BASKET RETRIVAL;  Surgeon: [name], MD;  Location: [facility];  Service: Urology;  Laterality: Bilateral;  75 MINS  . CYSTOSCOPY WITH STENT PLACEMENT Left 12/2010  . ESOPHAGOGASTRODUODENOSCOPY (EGD) WITH ESOPHAGEAL DILATION  03/ 2017  dr Lyndel Safe  . EXTRACORPOREAL SHOCK WAVE LITHOTRIPSY  02-27-2011;  06-29-2014  @[facility]   . FOOT SURGERY Right 1990s   removal growth, benign per pt  . HEMORRHOID SURGERY  2007 approx.  Marland Kitchen HOLMIUM LASER APPLICATION Bilateral 1/75/1025   Procedure: HOLMIUM LASER APPLICATION;  Surgeon: [name], MD;  Location: [facility];  Service: Urology;  Laterality: Bilateral;  . TONSILLECTOMY AND ADENOIDECTOMY  age 52    Family History  Problem Relation Age of Onset  . Cancer Mother   . Cancer Father    Social History:  reports that he has never smoked. He has never used smokeless tobacco. He reports that he does not drink alcohol and does not use drugs.  Allergies:  Allergies  Allergen Reactions  . Nabumetone     Blistered skin  . Tizanidine Other (See  Comments)    Hallcinations, agitation    Medications Prior to Admission  Medication Sig Dispense Refill  . cyanocobalamin (,VITAMIN B-12,) 1000 MCG/ML injection Inject 1,000 mcg into the muscle every 30 (thirty) days. Pt states his primary manages the b12 and his daughter does it for him    . ketorolac (TORADOL) 10 MG tablet Take 1 tablet (10 mg total) by mouth every 6 (six) hours as needed for moderate pain. Or stent discomfort post-operatively 20 tablet 0  . oxyCODONE-acetaminophen (PERCOCET) 5-325 MG tablet Take 1 tablet by mouth every 6 (six) hours as needed for  severe pain. Post-operatively 15 tablet 0  . Polyvinyl Alcohol-Povidone (REFRESH OP) Apply to eye daily.    Marland Kitchen senna-docusate (SENOKOT-S) 8.6-50 MG tablet Take 1 tablet by mouth 2 (two) times daily. While taking strongest pain meds to prevent constipation. 10 tablet 0  . simvastatin (ZOCOR) 20 MG tablet Take 20 mg by mouth at bedtime.     . tamsulosin (FLOMAX) 0.4 MG CAPS capsule Take 0.4 mg by mouth at bedtime.       Results for orders placed or performed during the hospital encounter of 04/14/20 (from the past 48 hour(s))  I-STAT, chem 8     Status: Abnormal   Collection Time: 04/14/20 12:49 PM  Result Value Ref Range   Sodium 142 135 - 145 mmol/L   Potassium 4.3 3.5 - 5.1 mmol/L   Chloride 106 98 - 111 mmol/L   BUN 23 8 - 23 mg/dL   Creatinine, Ser 1.20 0.61 - 1.24 mg/dL   Glucose, Bld 97 70 - 99 mg/dL    Comment: Glucose reference range applies only to samples taken after fasting for at least 8 hours.   Calcium, Ion 1.14 (L) 1.15 - 1.40 mmol/L   TCO2 25 22 - 32 mmol/L   Hemoglobin 16.0 13.0 - 17.0 g/dL   HCT 47.0 39 - 52 %   No results found.  Review of Systems  Constitutional: Negative for chills and fever.  Genitourinary: Positive for urgency.  All other systems reviewed and are negative.   Blood pressure (!) 143/82, pulse 91, temperature 98.4 F (36.9 C), temperature source Oral, resp. rate 18, height 5\' 7"  (1.702 m), weight 92.6 kg, SpO2 97 %. Physical Exam Vitals reviewed.  Constitutional:      Comments: Very pleasant, at baseline.   HENT:     Head: Normocephalic.     Nose: Nose normal.  Eyes:     Pupils: Pupils are equal, round, and reactive to light.  Cardiovascular:     Rate and Rhythm: Normal rate.  Pulmonary:     Effort: Pulmonary effort is normal.  Abdominal:     General: Abdomen is flat.  Genitourinary:    Penis: Normal.      Comments: Minimal CVAt at present.  Musculoskeletal:        General: Normal range of motion.     Cervical back: Normal range  of motion.  Skin:    General: Skin is warm.  Psychiatric:        Mood and Affect: Mood normal.      Assessment/Plan  PRoceed as planned. Risks, benefits, alternatives, expected peri-op course discussed previously and reiteratd today.   [name], MD 04/14/2020, 2:56 PM

## 2020-04-15 ENCOUNTER — Encounter (HOSPITAL_BASED_OUTPATIENT_CLINIC_OR_DEPARTMENT_OTHER): Payer: Self-pay | Admitting: Urology

## 2020-04-27 DIAGNOSIS — N281 Cyst of kidney, acquired: Secondary | ICD-10-CM | POA: Diagnosis not present

## 2020-04-27 DIAGNOSIS — R351 Nocturia: Secondary | ICD-10-CM | POA: Diagnosis not present

## 2020-04-27 DIAGNOSIS — N202 Calculus of kidney with calculus of ureter: Secondary | ICD-10-CM | POA: Diagnosis not present

## 2020-05-02 DIAGNOSIS — N200 Calculus of kidney: Secondary | ICD-10-CM | POA: Diagnosis not present

## 2020-05-03 DIAGNOSIS — N200 Calculus of kidney: Secondary | ICD-10-CM | POA: Diagnosis not present

## 2020-05-04 DIAGNOSIS — Z23 Encounter for immunization: Secondary | ICD-10-CM | POA: Diagnosis not present

## 2020-06-22 DIAGNOSIS — N202 Calculus of kidney with calculus of ureter: Secondary | ICD-10-CM | POA: Diagnosis not present

## 2020-06-22 DIAGNOSIS — R34 Anuria and oliguria: Secondary | ICD-10-CM | POA: Diagnosis not present

## 2020-06-22 DIAGNOSIS — R351 Nocturia: Secondary | ICD-10-CM | POA: Diagnosis not present

## 2020-06-22 DIAGNOSIS — N281 Cyst of kidney, acquired: Secondary | ICD-10-CM | POA: Diagnosis not present

## 2020-06-29 ENCOUNTER — Ambulatory Visit: Payer: Medicare PPO | Admitting: Neurology

## 2020-06-29 ENCOUNTER — Encounter: Payer: Self-pay | Admitting: Neurology

## 2020-06-29 VITALS — BP 126/74 | HR 70 | Ht 67.0 in | Wt 209.2 lb

## 2020-06-29 DIAGNOSIS — G250 Essential tremor: Secondary | ICD-10-CM

## 2020-06-29 DIAGNOSIS — R413 Other amnesia: Secondary | ICD-10-CM | POA: Diagnosis not present

## 2020-06-29 MED ORDER — DONEPEZIL HCL 10 MG PO TABS: 10.0000 mg | ORAL_TABLET | Freq: Every day | ORAL | 5 refills | Status: DC

## 2020-06-29 NOTE — Progress Notes (Signed)
I have read the note, and I agree with the clinical assessment and plan.  Bethene Hankinson K Whitaker Holderman   

## 2020-06-29 NOTE — Patient Instructions (Signed)
Start Aricept, taking 1/2 tablet at bedtime for 1 month then increase to 1 full tablet for memory   Donepezil tablets What is this medicine? DONEPEZIL (doe NEP e zil) is used to treat mild to moderate dementia caused by Alzheimer's disease. This medicine may be used for other purposes; ask your health care provider or pharmacist if you have questions. COMMON BRAND NAME(S): Aricept What should I tell my health care provider before I take this medicine? They need to know if you have any of these conditions:  asthma or other lung disease  difficulty passing urine  head injury  heart disease  history of irregular heartbeat  liver disease  seizures (convulsions)  stomach or intestinal disease, ulcers or stomach bleeding  an unusual or allergic reaction to donepezil, other medicines, foods, dyes, or preservatives  pregnant or trying to get pregnant  breast-feeding How should I use this medicine? Take this medicine by mouth with a glass of water. Follow the directions on the prescription label. You may take this medicine with or without food. Take this medicine at regular intervals. This medicine is usually taken before bedtime. Do not take it more often than directed. Continue to take your medicine even if you feel better. Do not stop taking except on your doctor's advice. If you are taking the 23 mg donepezil tablet, swallow it whole; do not cut, crush, or chew it. Talk to your pediatrician regarding the use of this medicine in children. Special care may be needed. Overdosage: If you think you have taken too much of this medicine contact a poison control center or emergency room at once. NOTE: This medicine is only for you. Do not share this medicine with others. What if I miss a dose? If you miss a dose, take it as soon as you can. If it is almost time for your next dose, take only that dose, do not take double or extra doses. What may interact with this medicine? Do not take this  medicine with any of the following medications:  certain medicines for fungal infections like itraconazole, fluconazole, posaconazole, and voriconazole  cisapride  dextromethorphan; quinidine  dronedarone  pimozide  quinidine  thioridazine This medicine may also interact with the following medications:  antihistamines for allergy, cough and cold  atropine  bethanechol  carbamazepine  certain medicines for bladder problems like oxybutynin, tolterodine  certain medicines for Parkinson's disease like benztropine, trihexyphenidyl  certain medicines for stomach problems like dicyclomine, hyoscyamine  certain medicines for travel sickness like scopolamine  dexamethasone  dofetilide  ipratropium  NSAIDs, medicines for pain and inflammation, like ibuprofen or naproxen  other medicines for Alzheimer's disease  other medicines that prolong the QT interval (cause an abnormal heart rhythm)  phenobarbital  phenytoin  rifampin, rifabutin or rifapentine  ziprasidone This list may not describe all possible interactions. Give your health care provider a list of all the medicines, herbs, non-prescription drugs, or dietary supplements you use. Also tell them if you smoke, drink alcohol, or use illegal drugs. Some items may interact with your medicine. What should I watch for while using this medicine? Visit your doctor or health care professional for regular checks on your progress. Check with your doctor or health care professional if your symptoms do not get better or if they get worse. You may get drowsy or dizzy. Do not drive, use machinery, or do anything that needs mental alertness until you know how this drug affects you. What side effects may I notice from receiving  this medicine? Side effects that you should report to your doctor or health care professional as soon as possible:  allergic reactions like skin rash, itching or hives, swelling of the face, lips, or  tongue  feeling faint or lightheaded, falls  loss of bladder control  seizures  signs and symptoms of a dangerous change in heartbeat or heart rhythm like chest pain; dizziness; fast or irregular heartbeat; palpitations; feeling faint or lightheaded, falls; breathing problems  signs and symptoms of infection like fever or chills; cough; sore throat; pain or trouble passing urine  signs and symptoms of liver injury like dark yellow or brown urine; general ill feeling or flu-like symptoms; light-colored stools; loss of appetite; nausea; right upper belly pain; unusually weak or tired; yellowing of the eyes or skin  slow heartbeat or palpitations  unusual bleeding or bruising  vomiting Side effects that usually do not require medical attention (report to your doctor or health care professional if they continue or are bothersome):  diarrhea, especially when starting treatment  headache  loss of appetite  muscle cramps  nausea  stomach upset This list may not describe all possible side effects. Call your doctor for medical advice about side effects. You may report side effects to FDA at 1-800-FDA-1088. Where should I keep my medicine? Keep out of reach of children. Store at room temperature between 15 and 30 degrees C (59 and 86 degrees F). Throw away any unused medicine after the expiration date. NOTE: This sheet is a summary. It may not cover all possible information. If you have questions about this medicine, talk to your doctor, pharmacist, or health care provider.  2020 Elsevier/Gold Standard (2018-07-08 10:33:41)

## 2020-06-29 NOTE — Progress Notes (Signed)
PATIENT: Robert Oconnell. DOB: Jul 29, 1946  REASON FOR VISIT: follow up HISTORY FROM: patient  HISTORY OF PRESENT ILLNESS: Today 06/29/20 Robert Oconnell is a 74 year old male with history of difficulty with handwriting, tremor mostly to the right hand, and forgetfulness.  MRI of the brain showed mild generalized atrophy, no significant small vessel disease.  Tremor remains stable, less impactful with handwriting, not much trouble eating, may grip a sandwich tight.  Has benign forgetfulness, may have trouble finding a word.  Remains active, enjoys reading, he and his wife share household duties.  He drives a car.  He enjoys collecting pencils, match books, and yard sticks.  Overall, is more concerned about the memory, his daughter is a Marine scientist, has mentioned it, would like to be proactive. MMSE was 25/30 today.  Had a procedure for kidney stones a few months ago.  Presents today for evaluation accompanied by his wife.  HISTORY 10/28/2019 Dr. Jannifer Franklin: Robert Oconnell is a 75 year old right-handed white male with a history of some difficulty with handwriting that began about 1 year ago and this has slightly worsened over time.  He mainly notes tremor with his right hand, he is right-handed.  He indicates that the handwriting is affected, he will see tremor that is translated into the handwriting.  He has started to print more as this is more legible for him.  Around the same time that the tremor began, he has noted some mild forgetfulness.  This has not altered any of his activities of daily living.  He does have a history of vitamin B12 deficiency and is on supplementation for this.  He is able to operate a motor vehicle without much difficulty, occasionally he will get turned around with directions.  He has not had any significant issues managing the finances or managing his medications or appointments.  He does snore at night, he may nap during the day but he does not have significant daytime drowsiness or fatigue.  He  does not drink a lot of caffeinated products.  He reports no numbness or weakness of extremities, he denies any balance issues or difficulty controlling the bowels or the bladder.  He reports no family history of tremor although his father died in his 81s.  He does not know a lot about his father side of the family.  The patient does not report any significant problems with feeding himself.  He is sent to this office for an evaluation.   REVIEW OF SYSTEMS: Out of a complete 14 system review of symptoms, the patient complains only of the following symptoms, and all other reviewed systems are negative.  Tremor, memory loss  ALLERGIES: Allergies  Allergen Reactions  . Nabumetone     Blistered skin  . Tizanidine Other (See Comments)    Hallcinations, agitation    HOME MEDICATIONS: Outpatient Medications Prior to Visit  Medication Sig Dispense Refill  . simvastatin (ZOCOR) 20 MG tablet Take 20 mg by mouth at bedtime.     . tamsulosin (FLOMAX) 0.4 MG CAPS capsule Take 0.4 mg by mouth at bedtime.     . cephALEXin (KEFLEX) 500 MG capsule Take 1 capsule (500 mg total) by mouth 2 (two) times daily. X 3 days to prevent infection with tethered stent. 6 capsule 0  . cyanocobalamin (,VITAMIN B-12,) 1000 MCG/ML injection Inject 1,000 mcg into the muscle every 30 (thirty) days. Pt states his primary manages the b12 and his daughter does it for him    . ketorolac (TORADOL) 10  MG tablet Take 1 tablet (10 mg total) by mouth every 8 (eight) hours as needed for moderate pain. Or stent discomfort post-operatively 20 tablet 0  . oxyCODONE-acetaminophen (PERCOCET) 5-325 MG tablet Take 1 tablet by mouth every 6 (six) hours as needed for severe pain. Post-operatively 10 tablet 0  . Polyvinyl Alcohol-Povidone (REFRESH OP) Apply to eye daily.    Marland Kitchen senna-docusate (SENOKOT-S) 8.6-50 MG tablet Take 1 tablet by mouth 2 (two) times daily. While taking strongest pain meds to prevent constipation. 10 tablet 0   No  facility-administered medications prior to visit.    PAST MEDICAL HISTORY: Past Medical History:  Diagnosis Date  . Eosinophilic esophagitis 65/9935   dx thru EGD w/ bx by dr Lyndel Safe;  followed by allergy / asthma clinic last note in epic  . Hematuria    since 04-05-2020 urine tinged red  . History of gastroesophageal reflux (GERD)   . History of kidney stones   . Hyperlipidemia   . Lower back pain    04-02-2020  . Mild memory disturbance    neurologist--- dr Jannifer Franklin  . Nocturia   . OSA (obstructive sleep apnea)    per pt  used cpap until he lost weight, stated no longer needed it,  last used approx. 2013  . Renal calculus, left   . 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 Urgency of urination     PAST SURGICAL HISTORY: Past Surgical History:  Procedure Laterality Date  . CATARACT EXTRACTION W/ INTRAOCULAR LENS  IMPLANT, BILATERAL  2010  . CYSTOSCOPY W/ URETERAL STENT REMOVAL Right 04/14/2020   Procedure: CYSTOSCOPY WITH STENT REMOVAL;  Surgeon: [name], MD;  Location: [facility];  Service: Urology;  Laterality: Right;  . CYSTOSCOPY WITH RETROGRADE PYELOGRAM, URETEROSCOPY AND STENT PLACEMENT Bilateral 03/24/2020   Procedure: CYSTOSCOPY WITH RETROGRADE PYELOGRAM, URETEROSCOPY AND STENT PLACEMENT STONE BASKET RETRIVAL;  Surgeon: [name], MD;  Location: [facility];  Service: Urology;  Laterality: Bilateral;  75 MINS  . CYSTOSCOPY WITH RETROGRADE PYELOGRAM, URETEROSCOPY AND STENT PLACEMENT Left 04/14/2020   Procedure: CYSTOSCOPY WITH RETROGRADE PYELOGRAM, URETEROSCOPY AND STENT EXCHANGE STONE BASKET EXTRACTION;  Surgeon: [name], MD;  Location: [facility];  Service: Urology;  Laterality: Left;  1 HR  . CYSTOSCOPY WITH STENT PLACEMENT Left 12/2010  . ESOPHAGOGASTRODUODENOSCOPY (EGD) WITH ESOPHAGEAL DILATION  03/ 2017  dr Lyndel Safe  . EXTRACORPOREAL SHOCK WAVE LITHOTRIPSY  02-27-2011;  06-29-2014  @[facility]   . FOOT SURGERY Right 1990s    removal growth, benign per pt  . HEMORRHOID SURGERY  2007 approx.  Marland Kitchen HOLMIUM LASER APPLICATION Bilateral 01/29/7792   Procedure: HOLMIUM LASER APPLICATION;  Surgeon: [name], MD;  Location: [facility];  Service: Urology;  Laterality: Bilateral;  . TONSILLECTOMY AND ADENOIDECTOMY  age 74    FAMILY HISTORY: Family History  Problem Relation Age of Onset  . Cancer Mother   . Cancer Father     SOCIAL HISTORY: Social History   Socioeconomic History  . Marital status: Married    Spouse name: Not on file  . Number of children: Not on file  . Years of education: Not on file  . Highest education level: Not on file  Occupational History  . Not on file  Tobacco Use  . Smoking status: Never Smoker  . Smokeless tobacco: Never Used  Vaping Use  . Vaping Use: Never used  Substance and Sexual Activity  . Alcohol use: No  . Drug use: Never  . Sexual activity:  Not on file  Other Topics Concern  . Not on file  Social History Narrative  . Not on file   Social Determinants of Health   Financial Resource Strain:   . Difficulty of Paying Living Expenses: Not on file  Food Insecurity:   . Worried About Charity fundraiser in the Last Year: Not on file  . Ran Out of Food in the Last Year: Not on file  Transportation Needs:   . Lack of Transportation (Medical): Not on file  . Lack of Transportation (Non-Medical): Not on file  Physical Activity:   . Days of Exercise per Week: Not on file  . Minutes of Exercise per Session: Not on file  Stress:   . Feeling of Stress : Not on file  Social Connections:   . Frequency of Communication with Friends and Family: Not on file  . Frequency of Social Gatherings with Friends and Family: Not on file  . Attends Religious Services: Not on file  . Active Member of Clubs or Organizations: Not on file  . Attends Archivist Meetings: Not on file  . Marital Status: Not on file  Intimate Partner Violence:   . Fear of  Current or Ex-Partner: Not on file  . Emotionally Abused: Not on file  . Physically Abused: Not on file  . Sexually Abused: Not on file   PHYSICAL EXAM  Vitals:   06/29/20 1024  BP: 126/74  Pulse: 70  Weight: 209 lb 3.2 oz (94.9 kg)  Height: 5\' 7"  (1.702 m)   Body mass index is 32.77 kg/m.  Generalized: Well developed, in no acute distress  MMSE - Mini Mental State Exam 06/29/2020 10/28/2019  Orientation to time 4 5  Orientation to Place 5 5  Registration 3 3  Attention/ Calculation 4 3  Recall 1 3  Language- name 2 objects 2 2  Language- repeat 0 1  Language- follow 3 step command 3 3  Language- read & follow direction 1 1  Write a sentence 1 1  Copy design 1 1  Total score 25 28    Neurological examination  Mentation: Alert oriented to time, place, history taking. Follows all commands speech and language fluent Cranial nerve II-XII: Pupils were equal round reactive to light. Extraocular movements were full, visual field were full on confrontational test. Facial sensation and strength were normal. Head turning and shoulder shrug  were normal and symmetric. Motor: The motor testing reveals 5 over 5 strength of all 4 extremities. Good symmetric motor tone is noted throughout.  No resting tremor noted. Sensory: Sensory testing is intact to soft touch on all 4 extremities. No evidence of extinction is noted.  Coordination: Cerebellar testing reveals good finger-nose-finger and heel-to-shin bilaterally.  Mild tremor with finger-nose-finger bilaterally, greater in the right than left.  A handwriting sample shows fairly well-preserved spiral drawl, more tremor seen writing name. Gait and station: Gait is normal, good pace, arm swing, turns Reflexes: Deep tendon reflexes are symmetric and normal bilaterally.   DIAGNOSTIC DATA (LABS, IMAGING, TESTING) - I reviewed patient records, labs, notes, testing and imaging myself where available.  Lab Results  Component Value Date   HGB  16.0 04/14/2020   HCT 47.0 04/14/2020      Component Value Date/Time   NA 142 04/14/2020 1249   K 4.3 04/14/2020 1249   CL 106 04/14/2020 1249   GLUCOSE 97 04/14/2020 1249   BUN 23 04/14/2020 1249   CREATININE 1.20 04/14/2020 1249  No results found for: CHOL, HDL, LDLCALC, LDLDIRECT, TRIG, CHOLHDL No results found for: HGBA1C No results found for: VITAMINB12 No results found for: TSH  ASSESSMENT AND PLAN 74 y.o. year old male  has a past medical history of Eosinophilic esophagitis (88/8916), Hematuria, History of gastroesophageal reflux (GERD), History of kidney stones, Hyperlipidemia, Lower back pain, Mild memory disturbance, Nocturia, OSA (obstructive sleep apnea), Renal calculus, left, Tremor, essential (10/28/2019), and Urgency of urination. here with:  1. Tremor, likely essential tremor -Remains overall stable, is mild at this point  2. Mild Memory disturbance  -MMSE 25/30 today -Start Aricept working up to 10 mg at bedtime -Encouraged continued activity, exercise  -Follow-up in 6 months or sooner if needed  I spent 30 minutes of face-to-face and non-face-to-face time with patient.  This included previsit chart review, lab review, study review, order entry, electronic health record documentation, patient education.  Butler Denmark, AGNP-C, DNP 06/29/2020, 10:33 AM [facility] [address], [place], [place] [address] [phone]

## 2020-08-25 DIAGNOSIS — Z79899 Other long term (current) drug therapy: Secondary | ICD-10-CM | POA: Diagnosis not present

## 2020-08-25 DIAGNOSIS — E039 Hypothyroidism, unspecified: Secondary | ICD-10-CM | POA: Diagnosis not present

## 2020-08-25 DIAGNOSIS — E7800 Pure hypercholesterolemia, unspecified: Secondary | ICD-10-CM | POA: Diagnosis not present

## 2020-08-31 DIAGNOSIS — Z6833 Body mass index (BMI) 33.0-33.9, adult: Secondary | ICD-10-CM | POA: Diagnosis not present

## 2020-08-31 DIAGNOSIS — Z1331 Encounter for screening for depression: Secondary | ICD-10-CM | POA: Diagnosis not present

## 2020-08-31 DIAGNOSIS — Z0000 Encounter for general adult medical examination without abnormal findings: Secondary | ICD-10-CM | POA: Diagnosis not present

## 2020-09-15 DIAGNOSIS — H26491 Other secondary cataract, right eye: Secondary | ICD-10-CM | POA: Diagnosis not present

## 2020-09-28 ENCOUNTER — Other Ambulatory Visit: Payer: Self-pay | Admitting: *Deleted

## 2020-09-28 MED ORDER — DONEPEZIL HCL 10 MG PO TABS: 10.0000 mg | ORAL_TABLET | Freq: Every day | ORAL | 1 refills | Status: DC

## 2020-12-15 ENCOUNTER — Telehealth: Payer: Self-pay

## 2020-12-15 NOTE — Telephone Encounter (Signed)
LVM for daughter to call me back

## 2020-12-16 NOTE — Telephone Encounter (Signed)
Daughter returned call. Please call back when available.

## 2020-12-22 ENCOUNTER — Ambulatory Visit: Payer: Medicare PPO | Admitting: Neurology

## 2021-01-07 DIAGNOSIS — D485 Neoplasm of uncertain behavior of skin: Secondary | ICD-10-CM | POA: Diagnosis not present

## 2021-01-07 DIAGNOSIS — L570 Actinic keratosis: Secondary | ICD-10-CM | POA: Diagnosis not present

## 2021-01-07 DIAGNOSIS — D225 Melanocytic nevi of trunk: Secondary | ICD-10-CM | POA: Diagnosis not present

## 2021-01-07 DIAGNOSIS — L820 Inflamed seborrheic keratosis: Secondary | ICD-10-CM | POA: Diagnosis not present

## 2021-01-07 DIAGNOSIS — D1801 Hemangioma of skin and subcutaneous tissue: Secondary | ICD-10-CM | POA: Diagnosis not present

## 2021-01-07 DIAGNOSIS — D2239 Melanocytic nevi of other parts of face: Secondary | ICD-10-CM | POA: Diagnosis not present

## 2021-01-17 DIAGNOSIS — W57XXXA Bitten or stung by nonvenomous insect and other nonvenomous arthropods, initial encounter: Secondary | ICD-10-CM | POA: Diagnosis not present

## 2021-01-17 DIAGNOSIS — R21 Rash and other nonspecific skin eruption: Secondary | ICD-10-CM | POA: Diagnosis not present

## 2021-01-17 DIAGNOSIS — Z6833 Body mass index (BMI) 33.0-33.9, adult: Secondary | ICD-10-CM | POA: Diagnosis not present

## 2021-02-14 NOTE — Progress Notes (Signed)
PATIENT: Robert Oconnell. DOB: 04/19/1946  REASON FOR VISIT: follow up HISTORY FROM: patient Primary Neurologist: Dr. Jannifer Oconnell   HISTORY OF PRESENT ILLNESS: Today 02/15/21 Robert Oconnell is a 75 year old male, here today to follow up on difficulty with handwriting, tremor to the right hand with handwriting, and memory issues. MMSE 30/30 today. Started the Aricept last visit, 10 mg. Feels memory is the same. Wife feels making him more irritable. Most trouble is with writing, because he grips the pen so tight. Cannot write cursive because of tremor. Has lost about 20 lbs since last seen, likes to work outside, sweats. Continues to drive.  No resting tremor. No falls or balance issues. Only trouble with eating is griping a sandwich too tight, otherwise does well with utensils.  Here today accompanied by his wife.  Update 06/29/2020 SS: Robert Oconnell is a 75 year old male with history of difficulty with handwriting, tremor mostly to the right hand, and forgetfulness.  MRI of the brain showed mild generalized atrophy, no significant small vessel disease.  Tremor remains stable, less impactful with handwriting, not much trouble eating, may grip a sandwich tight.  Has benign forgetfulness, may have trouble finding a word.  Remains active, enjoys reading, he and his wife share household duties.  He drives a car.  He enjoys collecting pencils, match books, and yard sticks.  Overall, is more concerned about the memory, his daughter is a Marine scientist, has mentioned it, would like to be proactive. MMSE was 25/30 today.  Had a procedure for kidney stones a few months ago.  Presents today for evaluation accompanied by his wife.  HISTORY 10/28/2019 Dr. Jannifer Oconnell: Robert Oconnell is a 75 year old right-handed white male with a history of some difficulty with handwriting that began about 1 year ago and this has slightly worsened over time.  He mainly notes tremor with his right hand, he is right-handed.  He indicates that the handwriting is affected,  he will see tremor that is translated into the handwriting.  He has started to print more as this is more legible for him.  Around the same time that the tremor began, he has noted some mild forgetfulness.  This has not altered any of his activities of daily living.  He does have a history of vitamin B12 deficiency and is on supplementation for this.  He is able to operate a motor vehicle without much difficulty, occasionally he will get turned around with directions.  He has not had any significant issues managing the finances or managing his medications or appointments.  He does snore at night, he may nap during the day but he does not have significant daytime drowsiness or fatigue.  He does not drink a lot of caffeinated products.  He reports no numbness or weakness of extremities, he denies any balance issues or difficulty controlling the bowels or the bladder.  He reports no family history of tremor although his father died in his 74s.  He does not know a lot about his father side of the family.  The patient does not report any significant problems with feeding himself.  He is sent to this office for an evaluation.   REVIEW OF SYSTEMS: Out of a complete 14 system review of symptoms, the patient complains only of the following symptoms, and all other reviewed systems are negative.  Tremor, memory loss  ALLERGIES: Allergies  Allergen Reactions   Nabumetone     Blistered skin   Tizanidine Other (See Comments)    Hallcinations, agitation  HOME MEDICATIONS: Outpatient Medications Prior to Visit  Medication Sig Dispense Refill   donepezil (ARICEPT) 10 MG tablet Take 1 tablet (10 mg total) by mouth at bedtime. 90 tablet 1   fexofenadine (ALLEGRA) 180 MG tablet Take 180 mg by mouth daily.     simvastatin (ZOCOR) 20 MG tablet Take 20 mg by mouth at bedtime.      tamsulosin (FLOMAX) 0.4 MG CAPS capsule Take 0.4 mg by mouth at bedtime.      No facility-administered medications prior to visit.     PAST MEDICAL HISTORY: Past Medical History:  Diagnosis Date   Eosinophilic esophagitis 40/9811   dx thru EGD w/ bx by dr Lyndel Safe;  followed by allergy / asthma clinic last note in epic   Hematuria    since 04-05-2020 urine tinged red   History of gastroesophageal reflux (GERD)    History of kidney stones    Hyperlipidemia    Lower back pain    04-02-2020   Mild memory disturbance    neurologist--- dr Robert Oconnell   Nocturia    OSA (obstructive sleep apnea)    per pt  used cpap until he lost weight, stated no longer needed it,  last used approx. 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   Urgency of urination     PAST SURGICAL HISTORY: Past Surgical History:  Procedure Laterality Date   CATARACT EXTRACTION W/ INTRAOCULAR LENS  IMPLANT, BILATERAL  2010   CYSTOSCOPY W/ URETERAL STENT REMOVAL Right 04/14/2020   Procedure: CYSTOSCOPY WITH STENT REMOVAL;  Surgeon: [name], MD;  Location: [facility];  Service: Urology;  Laterality: Right;   CYSTOSCOPY WITH RETROGRADE PYELOGRAM, URETEROSCOPY AND STENT PLACEMENT Bilateral 03/24/2020   Procedure: CYSTOSCOPY WITH RETROGRADE PYELOGRAM, URETEROSCOPY AND STENT PLACEMENT STONE BASKET RETRIVAL;  Surgeon: [name], MD;  Location: [facility];  Service: Urology;  Laterality: Bilateral;  66 MINS   CYSTOSCOPY WITH RETROGRADE PYELOGRAM, URETEROSCOPY AND STENT PLACEMENT Left 04/14/2020   Procedure: CYSTOSCOPY WITH RETROGRADE PYELOGRAM, URETEROSCOPY AND STENT EXCHANGE STONE BASKET EXTRACTION;  Surgeon: [name], MD;  Location: [facility];  Service: Urology;  Laterality: Left;  1 HR   CYSTOSCOPY WITH STENT PLACEMENT Left 12/2010   ESOPHAGOGASTRODUODENOSCOPY (EGD) WITH ESOPHAGEAL DILATION  03/ 2017  dr Lyndel Safe   EXTRACORPOREAL SHOCK WAVE LITHOTRIPSY  02-27-2011;  06-29-2014  @[facility]    FOOT SURGERY Right 1990s   removal growth, benign per pt   HEMORRHOID SURGERY  2007 approx.    HOLMIUM LASER APPLICATION Bilateral 04/13/7828   Procedure: HOLMIUM LASER APPLICATION;  Surgeon: [name], MD;  Location: [facility];  Service: Urology;  Laterality: Bilateral;   TONSILLECTOMY AND ADENOIDECTOMY  age 57    FAMILY HISTORY: Family History  Problem Relation Age of Onset   Cancer Mother    Cancer Father     SOCIAL HISTORY: Social History   Socioeconomic History   Marital status: Married    Spouse name: Not on file   Number of children: Not on file   Years of education: Not on file   Highest education level: Not on file  Occupational History   Not on file  Tobacco Use   Smoking status: Never   Smokeless tobacco: Never  Vaping Use   Vaping Use: Never used  Substance and Sexual Activity   Alcohol use: No   Drug use: Never   Sexual activity: Not on file  Other Topics Concern   Not  on file  Social History Narrative   Not on file   Social Determinants of Health   Financial Resource Strain: Not on file  Food Insecurity: Not on file  Transportation Needs: Not on file  Physical Activity: Not on file  Stress: Not on file  Social Connections: Not on file  Intimate Partner Violence: Not on file   PHYSICAL EXAM  Vitals:   02/15/21 0956  BP: (!) 152/81  Pulse: (!) 57  Weight: 196 lb 8 oz (89.1 kg)  Height: 5\' 7"  (1.702 m)    Body mass index is 30.78 kg/m.  Generalized: Well developed, in no acute distress  MMSE - Mini Mental State Exam 02/15/2021 06/29/2020 10/28/2019  Orientation to time 5 4 5   Orientation to Place 5 5 5   Registration 3 3 3   Attention/ Calculation 5 4 3   Recall 3 1 3   Language- name 2 objects 2 2 2   Language- repeat 1 0 1  Language- follow 3 step command 3 3 3   Language- read & follow direction 1 1 1   Write a sentence 1 1 1   Copy design 1 1 1   Copy design-comments 4-legged animals x 1 minute: 9 - -  Total score 30 25 28     Neurological examination  Mentation: Alert oriented to time, place, history taking.  Follows all commands speech and language fluent Cranial nerve II-XII: Pupils were equal round reactive to light. Extraocular movements were full, visual field were full on confrontational test. Facial sensation and strength were normal. Head turning and shoulder shrug  were normal and symmetric. Motor: The motor testing reveals 5 over 5 strength of all 4 extremities. Good symmetric motor tone is noted throughout.  No resting tremor noted. Sensory: Sensory testing is intact to soft touch on all 4 extremities. No evidence of extinction is noted.  Coordination: Cerebellar testing reveals good finger-nose-finger and heel-to-shin bilaterally.  Mild tremor with finger-nose-finger bilaterally, greater in the right than left.  A handwriting sample shows moderate tremor translated to handwriting sample Gait and station: Gait is normal, good pace, arm swing, turns; stands from seated position with arms crossed at chest Reflexes: Deep tendon reflexes are symmetric and normal bilaterally.   DIAGNOSTIC DATA (LABS, IMAGING, TESTING) - I reviewed patient records, labs, notes, testing and imaging myself where available.  Lab Results  Component Value Date   HGB 16.0 04/14/2020   HCT 47.0 04/14/2020      Component Value Date/Time   NA 142 04/14/2020 1249   K 4.3 04/14/2020 1249   CL 106 04/14/2020 1249   GLUCOSE 97 04/14/2020 1249   BUN 23 04/14/2020 1249   CREATININE 1.20 04/14/2020 1249   No results found for: CHOL, HDL, LDLCALC, LDLDIRECT, TRIG, CHOLHDL No results found for: HGBA1C No results found for: VITAMINB12 No results found for: TSH  ASSESSMENT AND PLAN 75 y.o. year old male  has a past medical history of Eosinophilic esophagitis (88/8916), Hematuria, History of gastroesophageal reflux (GERD), History of kidney stones, Hyperlipidemia, Lower back pain, Mild memory disturbance, Nocturia, OSA (obstructive sleep apnea), Renal calculus, left, Tremor, essential (10/28/2019), and Urgency of  urination. here with:  1. Tremor, likely essential tremor -Remains overall stable, no signs of Parkinson's disease -Hold off on medications, mostly just affects handwriting with the right hand  2. Mild Memory disturbance  -MMSE 30/30 today, no memory improvement with Aricept -Decrease Aricept 5 mg at bedtime, due to reported agitation, try this dose for 2 to 4 weeks, if the mood does  not improve, will stop the medication -Follow-up in 6 months or sooner if needed, call for any medication issues, may consider trial of Namenda in the future  I spent 32 minutes of face-to-face and non-face-to-face time with patient.  This included previsit chart review, lab review, study review, order entry, discussing memory, medications, management, and follow-up.   Butler Denmark, AGNP-C, DNP 02/15/2021, 10:12 AM [facility] [address], [place], [place] [address] [phone]

## 2021-02-15 ENCOUNTER — Ambulatory Visit: Payer: Medicare PPO | Admitting: Neurology

## 2021-02-15 ENCOUNTER — Encounter: Payer: Self-pay | Admitting: Neurology

## 2021-02-15 VITALS — BP 152/81 | HR 57 | Ht 67.0 in | Wt 196.5 lb

## 2021-02-15 DIAGNOSIS — G250 Essential tremor: Secondary | ICD-10-CM | POA: Diagnosis not present

## 2021-02-15 DIAGNOSIS — R413 Other amnesia: Secondary | ICD-10-CM | POA: Diagnosis not present

## 2021-02-15 MED ORDER — DONEPEZIL HCL 5 MG PO TABS: 5.0000 mg | ORAL_TABLET | Freq: Every day | ORAL | 5 refills | Status: DC

## 2021-02-15 NOTE — Progress Notes (Signed)
I have read the note, and I agree with the clinical assessment and plan.  Missey Hasley K Jezebelle Ledwell   

## 2021-02-15 NOTE — Patient Instructions (Addendum)
Cut back Aricept to 5 mg at bedtime, try this for 2-4 weeks if no improvement in mood, stop it Memory score is 30/30 See you back in 6 months

## 2021-02-28 ENCOUNTER — Other Ambulatory Visit: Payer: Self-pay | Admitting: Neurology

## 2021-03-28 DIAGNOSIS — E7800 Pure hypercholesterolemia, unspecified: Secondary | ICD-10-CM | POA: Diagnosis not present

## 2021-03-28 DIAGNOSIS — Z6832 Body mass index (BMI) 32.0-32.9, adult: Secondary | ICD-10-CM | POA: Diagnosis not present

## 2021-03-28 DIAGNOSIS — I861 Scrotal varices: Secondary | ICD-10-CM | POA: Diagnosis not present

## 2021-04-18 ENCOUNTER — Other Ambulatory Visit: Payer: Self-pay | Admitting: Neurology

## 2021-05-18 ENCOUNTER — Other Ambulatory Visit: Payer: Self-pay | Admitting: Neurology

## 2021-05-26 ENCOUNTER — Other Ambulatory Visit: Payer: Self-pay | Admitting: Neurology

## 2021-05-27 DIAGNOSIS — E7800 Pure hypercholesterolemia, unspecified: Secondary | ICD-10-CM | POA: Diagnosis not present

## 2021-05-27 DIAGNOSIS — Z6832 Body mass index (BMI) 32.0-32.9, adult: Secondary | ICD-10-CM | POA: Diagnosis not present

## 2021-05-27 DIAGNOSIS — Z23 Encounter for immunization: Secondary | ICD-10-CM | POA: Diagnosis not present

## 2021-05-27 DIAGNOSIS — D510 Vitamin B12 deficiency anemia due to intrinsic factor deficiency: Secondary | ICD-10-CM | POA: Diagnosis not present

## 2021-05-27 DIAGNOSIS — R413 Other amnesia: Secondary | ICD-10-CM | POA: Diagnosis not present

## 2021-06-06 DIAGNOSIS — G9389 Other specified disorders of brain: Secondary | ICD-10-CM | POA: Diagnosis not present

## 2021-06-06 DIAGNOSIS — R413 Other amnesia: Secondary | ICD-10-CM | POA: Diagnosis not present

## 2021-06-06 DIAGNOSIS — R930 Abnormal findings on diagnostic imaging of skull and head, not elsewhere classified: Secondary | ICD-10-CM | POA: Diagnosis not present

## 2021-06-07 DIAGNOSIS — C718 Malignant neoplasm of overlapping sites of brain: Secondary | ICD-10-CM | POA: Diagnosis not present

## 2021-06-07 DIAGNOSIS — R030 Elevated blood-pressure reading, without diagnosis of hypertension: Secondary | ICD-10-CM | POA: Diagnosis not present

## 2021-06-07 DIAGNOSIS — Z6830 Body mass index (BMI) 30.0-30.9, adult: Secondary | ICD-10-CM | POA: Diagnosis not present

## 2021-06-08 ENCOUNTER — Other Ambulatory Visit: Payer: Self-pay | Admitting: Radiation Therapy

## 2021-06-08 ENCOUNTER — Telehealth: Payer: Self-pay | Admitting: Internal Medicine

## 2021-06-08 DIAGNOSIS — D496 Neoplasm of unspecified behavior of brain: Secondary | ICD-10-CM

## 2021-06-08 NOTE — Telephone Encounter (Signed)
Scheduled appt per 11/9 referral. Called pt, no answer. Left msg for pt with appt date and time. Also requested for pt to call me back to confirm appt. I left my direct number for pt to call.

## 2021-06-09 ENCOUNTER — Other Ambulatory Visit: Payer: Self-pay | Admitting: Neurosurgery

## 2021-06-10 ENCOUNTER — Ambulatory Visit (HOSPITAL_COMMUNITY)
Admission: RE | Admit: 2021-06-10 | Discharge: 2021-06-10 | Disposition: A | Discharge status: Home / Self Care | Payer: Medicare PPO | Source: Ambulatory Visit | Attending: Neurosurgery | Admitting: Neurosurgery

## 2021-06-10 ENCOUNTER — Other Ambulatory Visit: Payer: Self-pay

## 2021-06-10 ENCOUNTER — Encounter (HOSPITAL_COMMUNITY): Payer: Self-pay | Admitting: Neurosurgery

## 2021-06-10 ENCOUNTER — Other Ambulatory Visit: Payer: Self-pay | Admitting: Neurosurgery

## 2021-06-10 ENCOUNTER — Other Ambulatory Visit (HOSPITAL_COMMUNITY): Payer: Self-pay | Admitting: Neurosurgery

## 2021-06-10 DIAGNOSIS — R4182 Altered mental status, unspecified: Secondary | ICD-10-CM | POA: Diagnosis not present

## 2021-06-10 DIAGNOSIS — Z9842 Cataract extraction status, left eye: Secondary | ICD-10-CM | POA: Diagnosis not present

## 2021-06-10 DIAGNOSIS — C719 Malignant neoplasm of brain, unspecified: Secondary | ICD-10-CM | POA: Diagnosis present

## 2021-06-10 DIAGNOSIS — U071 COVID-19: Secondary | ICD-10-CM | POA: Diagnosis present

## 2021-06-10 DIAGNOSIS — C718 Malignant neoplasm of overlapping sites of brain: Secondary | ICD-10-CM

## 2021-06-10 DIAGNOSIS — Z87442 Personal history of urinary calculi: Secondary | ICD-10-CM | POA: Diagnosis not present

## 2021-06-10 DIAGNOSIS — E785 Hyperlipidemia, unspecified: Secondary | ICD-10-CM | POA: Diagnosis present

## 2021-06-10 DIAGNOSIS — Z961 Presence of intraocular lens: Secondary | ICD-10-CM | POA: Diagnosis present

## 2021-06-10 DIAGNOSIS — J3489 Other specified disorders of nose and nasal sinuses: Secondary | ICD-10-CM | POA: Diagnosis not present

## 2021-06-10 DIAGNOSIS — D496 Neoplasm of unspecified behavior of brain: Secondary | ICD-10-CM | POA: Diagnosis present

## 2021-06-10 DIAGNOSIS — G4733 Obstructive sleep apnea (adult) (pediatric): Secondary | ICD-10-CM | POA: Diagnosis present

## 2021-06-10 DIAGNOSIS — G930 Cerebral cysts: Secondary | ICD-10-CM | POA: Diagnosis not present

## 2021-06-10 DIAGNOSIS — R410 Disorientation, unspecified: Secondary | ICD-10-CM | POA: Diagnosis not present

## 2021-06-10 DIAGNOSIS — R220 Localized swelling, mass and lump, head: Secondary | ICD-10-CM | POA: Diagnosis not present

## 2021-06-10 DIAGNOSIS — K219 Gastro-esophageal reflux disease without esophagitis: Secondary | ICD-10-CM | POA: Diagnosis present

## 2021-06-10 DIAGNOSIS — Z9841 Cataract extraction status, right eye: Secondary | ICD-10-CM | POA: Diagnosis not present

## 2021-06-10 MED ORDER — GADOBUTROL 1 MMOL/ML IV SOLN
9.0000 mL | Freq: Once | INTRAVENOUS | Status: AC | PRN
  Administered 2021-06-10: 9 mL via INTRAVENOUS

## 2021-06-10 NOTE — Progress Notes (Signed)
PCP - Dr. Lovette Cliche Cardiologist - Denies Oncologist: Dr. Cecil Cobbs Urology: Dr. [name]  PPM/ICD - Denies  Chest x-ray - N/A EKG - Denies Stress Test - Denies ECHO - Denies Cardiac Cath - Denies  Sleep Study - Yes, OSA over 10 years ago; does not use CPAP  Patient is not diabetic.  Blood Thinner Instructions: N/A Aspirin Instructions: LD: 06/09/21  ERAS Protcol - No  COVID TEST- DOS 06/13/21   Anesthesia review: No  Per patient's wife Robert Oconnell, patient showed no signs of shortness of breath, fever, cough and chest pain during phone call.   All instructions explained to the Robert Oconnell (spouse), with a verbal understanding of the instructions given over the phone. Robert Oconnell stated that patient has some cognitive memory impairment as well as hallucination episodes. The opportunity to ask questions was provided and the short stay pre-op number was given for any future questions.

## 2021-06-13 ENCOUNTER — Encounter (HOSPITAL_COMMUNITY): Payer: Self-pay | Admitting: Neurosurgery

## 2021-06-13 ENCOUNTER — Encounter (HOSPITAL_COMMUNITY)
Admission: RE | Disposition: A | Discharge status: Home / Self Care | Payer: Self-pay | Source: Home / Self Care | Attending: Neurosurgery

## 2021-06-13 ENCOUNTER — Inpatient Hospital Stay: Payer: Medicare PPO

## 2021-06-13 ENCOUNTER — Inpatient Hospital Stay (HOSPITAL_COMMUNITY): Payer: Medicare PPO | Admitting: Certified Registered Nurse Anesthetist

## 2021-06-13 ENCOUNTER — Other Ambulatory Visit: Payer: Self-pay

## 2021-06-13 ENCOUNTER — Inpatient Hospital Stay (HOSPITAL_COMMUNITY): Payer: Medicare PPO

## 2021-06-13 ENCOUNTER — Inpatient Hospital Stay (HOSPITAL_COMMUNITY)
Admission: RE | Admit: 2021-06-13 | Discharge: 2021-06-14 | DRG: 025 | Disposition: A | Discharge status: Home / Self Care | Payer: Medicare PPO | Attending: Neurosurgery | Admitting: Neurosurgery

## 2021-06-13 DIAGNOSIS — U071 COVID-19: Secondary | ICD-10-CM | POA: Diagnosis present

## 2021-06-13 DIAGNOSIS — G4733 Obstructive sleep apnea (adult) (pediatric): Secondary | ICD-10-CM | POA: Diagnosis present

## 2021-06-13 DIAGNOSIS — D496 Neoplasm of unspecified behavior of brain: Secondary | ICD-10-CM | POA: Diagnosis present

## 2021-06-13 DIAGNOSIS — Z9841 Cataract extraction status, right eye: Secondary | ICD-10-CM | POA: Diagnosis not present

## 2021-06-13 DIAGNOSIS — Z87442 Personal history of urinary calculi: Secondary | ICD-10-CM | POA: Diagnosis not present

## 2021-06-13 DIAGNOSIS — E785 Hyperlipidemia, unspecified: Secondary | ICD-10-CM | POA: Diagnosis present

## 2021-06-13 DIAGNOSIS — C719 Malignant neoplasm of brain, unspecified: Principal | ICD-10-CM | POA: Diagnosis present

## 2021-06-13 DIAGNOSIS — K219 Gastro-esophageal reflux disease without esophagitis: Secondary | ICD-10-CM | POA: Diagnosis present

## 2021-06-13 DIAGNOSIS — Z9842 Cataract extraction status, left eye: Secondary | ICD-10-CM

## 2021-06-13 DIAGNOSIS — Z9889 Other specified postprocedural states: Secondary | ICD-10-CM

## 2021-06-13 DIAGNOSIS — Z961 Presence of intraocular lens: Secondary | ICD-10-CM | POA: Diagnosis present

## 2021-06-13 HISTORY — PX: APPLICATION OF CRANIAL NAVIGATION: SHX6578

## 2021-06-13 HISTORY — PX: FRAMELESS  BIOPSY WITH BRAINLAB: SHX6879

## 2021-06-13 LAB — CBC
HCT: 43.8 % (ref 39.0–52.0)
Hemoglobin: 14.4 g/dL (ref 13.0–17.0)
MCH: 29.8 pg (ref 26.0–34.0)
MCHC: 32.9 g/dL (ref 30.0–36.0)
MCV: 90.7 fL (ref 80.0–100.0)
Platelets: 155 10*3/uL (ref 150–400)
RBC: 4.83 MIL/uL (ref 4.22–5.81)
RDW: 13.4 % (ref 11.5–15.5)
WBC: 5.6 10*3/uL (ref 4.0–10.5)
nRBC: 0 % (ref 0.0–0.2)

## 2021-06-13 LAB — SARS CORONAVIRUS 2 BY RT PCR (HOSPITAL ORDER, PERFORMED IN ~~LOC~~ HOSPITAL LAB): SARS Coronavirus 2: POSITIVE — AB

## 2021-06-13 LAB — BASIC METABOLIC PANEL
Anion gap: 7 (ref 5–15)
BUN: 17 mg/dL (ref 8–23)
CO2: 24 mmol/L (ref 22–32)
Calcium: 8.6 mg/dL — ABNORMAL LOW (ref 8.9–10.3)
Chloride: 108 mmol/L (ref 98–111)
Creatinine, Ser: 1.06 mg/dL (ref 0.61–1.24)
GFR, Estimated: >60 mL/min (ref >60)
Glucose, Bld: 110 mg/dL — ABNORMAL HIGH (ref 70–99)
Potassium: 3.8 mmol/L (ref 3.5–5.1)
Sodium: 139 mmol/L (ref 135–145)

## 2021-06-13 LAB — MRSA NEXT GEN BY PCR, NASAL: MRSA by PCR Next Gen: NOT DETECTED

## 2021-06-13 SURGERY — FRAMELESS BIOPSY WITH BRAINLAB
Anesthesia: General | Site: Head | Laterality: Right

## 2021-06-13 MED ORDER — CEFAZOLIN SODIUM-DEXTROSE 2-4 GM/100ML-% IV SOLN
2.0000 g | Freq: Three times a day (TID) | INTRAVENOUS | Status: AC
  Administered 2021-06-13 – 2021-06-14 (×2): 2 g via INTRAVENOUS
  Filled 2021-06-13 (×2): qty 100

## 2021-06-13 MED ORDER — LABETALOL HCL 5 MG/ML IV SOLN
INTRAVENOUS | Status: DC | PRN
  Administered 2021-06-13: 10 mg via INTRAVENOUS

## 2021-06-13 MED ORDER — CARBOXYMETHYLCELLULOSE SODIUM 0.5 % OP SOLN: 1.0000 [drp] | Freq: Every morning | OPHTHALMIC | Status: DC

## 2021-06-13 MED ORDER — ACETAMINOPHEN 10 MG/ML IV SOLN: 1000.0000 mg | Freq: Once | INTRAVENOUS | Status: DC | PRN

## 2021-06-13 MED ORDER — PANTOPRAZOLE SODIUM 40 MG IV SOLR
40.0000 mg | Freq: Every day | INTRAVENOUS | Status: DC
  Administered 2021-06-13: 40 mg via INTRAVENOUS
  Filled 2021-06-13: qty 40

## 2021-06-13 MED ORDER — PROPOFOL 10 MG/ML IV BOLUS
INTRAVENOUS | Status: AC
  Filled 2021-06-13: qty 20

## 2021-06-13 MED ORDER — CHLORHEXIDINE GLUCONATE 0.12 % MT SOLN
15.0000 mL | Freq: Once | OROMUCOSAL | Status: AC
  Administered 2021-06-13: 15 mL via OROMUCOSAL
  Filled 2021-06-13: qty 15

## 2021-06-13 MED ORDER — TAMSULOSIN HCL 0.4 MG PO CAPS
0.4000 mg | ORAL_CAPSULE | Freq: Every day | ORAL | Status: DC
  Administered 2021-06-13: 0.4 mg via ORAL
  Filled 2021-06-13: qty 1

## 2021-06-13 MED ORDER — ACETAMINOPHEN 325 MG PO TABS: 650.0000 mg | ORAL_TABLET | ORAL | Status: DC | PRN

## 2021-06-13 MED ORDER — PHENYLEPHRINE HCL-NACL 20-0.9 MG/250ML-% IV SOLN
INTRAVENOUS | Status: DC | PRN
  Administered 2021-06-13: 50 ug/min via INTRAVENOUS

## 2021-06-13 MED ORDER — POTASSIUM CHLORIDE IN NACL 20-0.9 MEQ/L-% IV SOLN
INTRAVENOUS | Status: DC
  Filled 2021-06-13 (×2): qty 1000

## 2021-06-13 MED ORDER — ONDANSETRON HCL 4 MG/2ML IJ SOLN: 4.0000 mg | Freq: Once | INTRAMUSCULAR | Status: DC | PRN

## 2021-06-13 MED ORDER — DEXAMETHASONE SODIUM PHOSPHATE 4 MG/ML IJ SOLN
4.0000 mg | Freq: Four times a day (QID) | INTRAMUSCULAR | Status: DC
  Administered 2021-06-14: 4 mg via INTRAVENOUS
  Filled 2021-06-13: qty 1

## 2021-06-13 MED ORDER — DEXAMETHASONE SODIUM PHOSPHATE 10 MG/ML IJ SOLN
INTRAMUSCULAR | Status: DC | PRN
  Administered 2021-06-13: 10 mg via INTRAVENOUS

## 2021-06-13 MED ORDER — BACITRACIN ZINC 500 UNIT/GM EX OINT
TOPICAL_OINTMENT | CUTANEOUS | Status: AC
  Filled 2021-06-13: qty 28.35

## 2021-06-13 MED ORDER — CEFAZOLIN SODIUM-DEXTROSE 2-3 GM-%(50ML) IV SOLR
INTRAVENOUS | Status: DC | PRN
  Administered 2021-06-13: 2 g via INTRAVENOUS

## 2021-06-13 MED ORDER — POLYVINYL ALCOHOL 1.4 % OP SOLN
1.0000 [drp] | Freq: Every day | OPHTHALMIC | Status: DC
  Administered 2021-06-14: 1 [drp] via OPHTHALMIC
  Filled 2021-06-13: qty 15

## 2021-06-13 MED ORDER — BACITRACIN ZINC 500 UNIT/GM EX OINT
TOPICAL_OINTMENT | CUTANEOUS | Status: DC | PRN
  Administered 2021-06-13: 1 via TOPICAL

## 2021-06-13 MED ORDER — ONDANSETRON HCL 4 MG/2ML IJ SOLN: 4.0000 mg | INTRAMUSCULAR | Status: DC | PRN

## 2021-06-13 MED ORDER — THROMBIN 5000 UNITS EX SOLR
CUTANEOUS | Status: AC
  Filled 2021-06-13: qty 5000

## 2021-06-13 MED ORDER — ONDANSETRON HCL 4 MG/2ML IJ SOLN
INTRAMUSCULAR | Status: DC | PRN
  Administered 2021-06-13: 4 mg via INTRAVENOUS

## 2021-06-13 MED ORDER — DONEPEZIL HCL 10 MG PO TABS
10.0000 mg | ORAL_TABLET | Freq: Every day | ORAL | Status: DC
  Administered 2021-06-13: 10 mg via ORAL
  Filled 2021-06-13: qty 1

## 2021-06-13 MED ORDER — BUPIVACAINE-EPINEPHRINE 0.5% -1:200000 IJ SOLN
INTRAMUSCULAR | Status: DC | PRN
  Administered 2021-06-13: 4 mL

## 2021-06-13 MED ORDER — 0.9 % SODIUM CHLORIDE (POUR BTL) OPTIME
TOPICAL | Status: DC | PRN
  Administered 2021-06-13: 1000 mL

## 2021-06-13 MED ORDER — FENTANYL CITRATE (PF) 100 MCG/2ML IJ SOLN: 25.0000 ug | INTRAMUSCULAR | Status: DC | PRN

## 2021-06-13 MED ORDER — ONDANSETRON HCL 4 MG PO TABS: 4.0000 mg | ORAL_TABLET | ORAL | Status: DC | PRN

## 2021-06-13 MED ORDER — CHLORHEXIDINE GLUCONATE CLOTH 2 % EX PADS
6.0000 | MEDICATED_PAD | Freq: Every day | CUTANEOUS | Status: DC
  Administered 2021-06-13 – 2021-06-14 (×2): 6 via TOPICAL

## 2021-06-13 MED ORDER — MORPHINE SULFATE (PF) 2 MG/ML IV SOLN: 2.0000 mg | INTRAVENOUS | Status: DC | PRN

## 2021-06-13 MED ORDER — FENTANYL CITRATE (PF) 250 MCG/5ML IJ SOLN
INTRAMUSCULAR | Status: AC
  Filled 2021-06-13: qty 5

## 2021-06-13 MED ORDER — SODIUM CHLORIDE 0.9 % IV SOLN: INTRAVENOUS | Status: DC

## 2021-06-13 MED ORDER — DEXAMETHASONE SODIUM PHOSPHATE 4 MG/ML IJ SOLN: 4.0000 mg | Freq: Three times a day (TID) | INTRAMUSCULAR | Status: DC

## 2021-06-13 MED ORDER — LABETALOL HCL 5 MG/ML IV SOLN: 10.0000 mg | INTRAVENOUS | Status: DC | PRN

## 2021-06-13 MED ORDER — ROCURONIUM BROMIDE 10 MG/ML (PF) SYRINGE
PREFILLED_SYRINGE | INTRAVENOUS | Status: DC | PRN
  Administered 2021-06-13: 100 mg via INTRAVENOUS

## 2021-06-13 MED ORDER — AMISULPRIDE (ANTIEMETIC) 5 MG/2ML IV SOLN: 10.0000 mg | Freq: Once | INTRAVENOUS | Status: DC | PRN

## 2021-06-13 MED ORDER — THROMBIN 5000 UNITS EX SOLR: OROMUCOSAL | Status: DC | PRN

## 2021-06-13 MED ORDER — PROPOFOL 10 MG/ML IV BOLUS
INTRAVENOUS | Status: DC | PRN
  Administered 2021-06-13: 200 mg via INTRAVENOUS

## 2021-06-13 MED ORDER — SUGAMMADEX SODIUM 200 MG/2ML IV SOLN
INTRAVENOUS | Status: DC | PRN
  Administered 2021-06-13 (×2): 200 mg via INTRAVENOUS

## 2021-06-13 MED ORDER — PROMETHAZINE HCL 25 MG PO TABS: 12.5000 mg | ORAL_TABLET | ORAL | Status: DC | PRN

## 2021-06-13 MED ORDER — HYDROCODONE-ACETAMINOPHEN 5-325 MG PO TABS: 1.0000 | ORAL_TABLET | ORAL | Status: DC | PRN

## 2021-06-13 MED ORDER — ORAL CARE MOUTH RINSE: 15.0000 mL | Freq: Once | OROMUCOSAL | Status: AC

## 2021-06-13 MED ORDER — ACETAMINOPHEN 650 MG RE SUPP: 650.0000 mg | RECTAL | Status: DC | PRN

## 2021-06-13 MED ORDER — LIDOCAINE-EPINEPHRINE 1 %-1:100000 IJ SOLN
INTRAMUSCULAR | Status: AC
  Filled 2021-06-13: qty 1

## 2021-06-13 MED ORDER — SUCCINYLCHOLINE CHLORIDE 200 MG/10ML IV SOSY
PREFILLED_SYRINGE | INTRAVENOUS | Status: DC | PRN
  Administered 2021-06-13: 90 mg via INTRAVENOUS

## 2021-06-13 MED ORDER — FENTANYL CITRATE (PF) 250 MCG/5ML IJ SOLN
INTRAMUSCULAR | Status: DC | PRN
  Administered 2021-06-13: 100 ug via INTRAVENOUS
  Administered 2021-06-13: 50 ug via INTRAVENOUS
  Administered 2021-06-13: 100 ug via INTRAVENOUS

## 2021-06-13 MED ORDER — LIDOCAINE HCL (CARDIAC) PF 100 MG/5ML IV SOSY
PREFILLED_SYRINGE | INTRAVENOUS | Status: DC | PRN
  Administered 2021-06-13: 60 mg via INTRATRACHEAL

## 2021-06-13 MED ORDER — LACTATED RINGERS IV SOLN: INTRAVENOUS | Status: DC

## 2021-06-13 MED ORDER — DOCUSATE SODIUM 100 MG PO CAPS
100.0000 mg | ORAL_CAPSULE | Freq: Two times a day (BID) | ORAL | Status: DC
  Administered 2021-06-13 – 2021-06-14 (×2): 100 mg via ORAL
  Filled 2021-06-13: qty 1

## 2021-06-13 MED ORDER — DEXAMETHASONE SODIUM PHOSPHATE 10 MG/ML IJ SOLN
6.0000 mg | Freq: Four times a day (QID) | INTRAMUSCULAR | Status: AC
  Administered 2021-06-13 – 2021-06-14 (×4): 6 mg via INTRAVENOUS
  Filled 2021-06-13 (×4): qty 1

## 2021-06-13 MED ORDER — BUPIVACAINE-EPINEPHRINE 0.5% -1:200000 IJ SOLN
INTRAMUSCULAR | Status: AC
  Filled 2021-06-13: qty 1

## 2021-06-13 MED ORDER — SIMVASTATIN 20 MG PO TABS
20.0000 mg | ORAL_TABLET | Freq: Every day | ORAL | Status: DC
  Administered 2021-06-13: 20 mg via ORAL
  Filled 2021-06-13: qty 1

## 2021-06-13 SURGICAL SUPPLY — 44 items
BAG COUNTER SPONGE SURGICOUNT (BAG) ×3 IMPLANT
BLADE CLIPPER SURG (BLADE) ×3 IMPLANT
BNDG ADH 1X3 SHEER STRL LF (GAUZE/BANDAGES/DRESSINGS) IMPLANT
BUR PRECISION FLUTE 6.0 (BURR) ×3 IMPLANT
CANISTER SUCT 3000ML PPV (MISCELLANEOUS) ×3 IMPLANT
CARTRIDGE OIL MAESTRO DRILL (MISCELLANEOUS) ×2 IMPLANT
CNTNR URN SCR LID CUP LEK RST (MISCELLANEOUS) ×2 IMPLANT
CONT SPEC 4OZ STRL OR WHT (MISCELLANEOUS) ×1
DIFFUSER DRILL AIR PNEUMATIC (MISCELLANEOUS) ×3 IMPLANT
DRAPE NEUROLOGICAL W/INCISE (DRAPES) ×3 IMPLANT
DRSG OPSITE POSTOP 3X4 (GAUZE/BANDAGES/DRESSINGS) ×3 IMPLANT
DURAPREP 26ML APPLICATOR (WOUND CARE) IMPLANT
ELECT REM PT RETURN 9FT ADLT (ELECTROSURGICAL) ×3
ELECTRODE REM PT RTRN 9FT ADLT (ELECTROSURGICAL) ×2 IMPLANT
GAUZE 4X4 16PLY ~~LOC~~+RFID DBL (SPONGE) ×3 IMPLANT
GLOVE EXAM NITRILE XL STR (GLOVE) IMPLANT
GLOVE SURG ENC MOIS LTX SZ8 (GLOVE) ×3 IMPLANT
GLOVE SURG ENC MOIS LTX SZ8.5 (GLOVE) ×3 IMPLANT
GOWN STRL REUS W/ TWL LRG LVL3 (GOWN DISPOSABLE) ×4 IMPLANT
GOWN STRL REUS W/ TWL XL LVL3 (GOWN DISPOSABLE) ×2 IMPLANT
GOWN STRL REUS W/TWL LRG LVL3 (GOWN DISPOSABLE) ×2
GOWN STRL REUS W/TWL XL LVL3 (GOWN DISPOSABLE) ×1
HEMOSTAT POWDER KIT SURGIFOAM (HEMOSTASIS) ×3 IMPLANT
KIT BASIN OR (CUSTOM PROCEDURE TRAY) ×3 IMPLANT
KIT NEEDLE BIOPSY 1.8X235 CRAN (NEEDLE) ×3 IMPLANT
KIT TURNOVER KIT B (KITS) ×3 IMPLANT
KIT VARIOGUIDE DRILL 1.9 (KITS) ×3 IMPLANT
MARKER SPHERE PSV REFLC 13MM (MARKER) ×9 IMPLANT
NEEDLE HYPO 25X1 1.5 SAFETY (NEEDLE) ×3 IMPLANT
NS IRRIG 1000ML POUR BTL (IV SOLUTION) ×3 IMPLANT
OIL CARTRIDGE MAESTRO DRILL (MISCELLANEOUS) ×3
PACK LAMINECTOMY NEURO (CUSTOM PROCEDURE TRAY) ×3 IMPLANT
PAD ARMBOARD 7.5X6 YLW CONV (MISCELLANEOUS) ×9 IMPLANT
PIN MAYFIELD SKULL DISP (PIN) ×3 IMPLANT
SPONGE SURGIFOAM ABS GEL SZ50 (HEMOSTASIS) IMPLANT
STAPLER SKIN PROX WIDE 3.9 (STAPLE) ×3 IMPLANT
SUT VIC AB 2-0 CP2 18 (SUTURE) ×3 IMPLANT
SYR 5ML LL (SYRINGE) IMPLANT
SYR 5ML LUER SLIP (SYRINGE) ×3 IMPLANT
TOWEL GREEN STERILE (TOWEL DISPOSABLE) ×3 IMPLANT
TOWEL GREEN STERILE FF (TOWEL DISPOSABLE) ×3 IMPLANT
TRAY FOLEY W/BAG SLVR 16FR (SET/KITS/TRAYS/PACK) ×1
TRAY FOLEY W/BAG SLVR 16FR ST (SET/KITS/TRAYS/PACK) ×2 IMPLANT
WATER STERILE IRR 1000ML POUR (IV SOLUTION) ×3 IMPLANT

## 2021-06-13 NOTE — Anesthesia Preprocedure Evaluation (Addendum)
Anesthesia Evaluation  Patient identified by MRN, date of birth, ID band Patient confused    Reviewed: Allergy & Precautions, NPO status , Patient's Chart, lab work & pertinent test results  Airway Mallampati: III  TM Distance: >3 FB Neck ROM: Full    Dental no notable dental hx.    Pulmonary sleep apnea ,    Pulmonary exam normal breath sounds clear to auscultation       Cardiovascular negative cardio ROS Normal cardiovascular exam Rhythm:Regular Rate:Normal     Neuro/Psych Confusion  negative psych ROS   GI/Hepatic negative GI ROS, Neg liver ROS,   Endo/Other  negative endocrine ROS  Renal/GU negative Renal ROS     Musculoskeletal negative musculoskeletal ROS (+)   Abdominal   Peds  Hematology HLD   Anesthesia Other Findings MALIGNANT NEOPLASM OF BRAIN COVID POSITIVE  Reproductive/Obstetrics                           Anesthesia Physical Anesthesia Plan  ASA: 3  Anesthesia Plan: General   Post-op Pain Management:    Induction: Intravenous  PONV Risk Score and Plan: 2 and Ondansetron, Dexamethasone and Treatment may vary due to age or medical condition  Airway Management Planned: Oral ETT  Additional Equipment: Arterial line  Intra-op Plan:   Post-operative Plan: Extubation in OR  Informed Consent: I have reviewed the patients History and Physical, chart, labs and discussed the procedure including the risks, benefits and alternatives for the proposed anesthesia with the patient or authorized representative who has indicated his/her understanding and acceptance.     Dental advisory given and Consent reviewed with POA  Plan Discussed with: CRNA  Anesthesia Plan Comments:        Anesthesia Quick Evaluation

## 2021-06-13 NOTE — Op Note (Signed)
Brief history: The patient is a 75 year old white male who presented with mental status and behavioral changes, confusion, etc.  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  I discussed the various treatment options with him.  He has decided to proceed with a stereotactic brain biopsy.  Pre-op diagnosis: Brain tumor  Postop diagnosis: The same  Procedure: Stereotactic right frontal brain biopsy using Brain[facility].  Surgeon: Dr. Earle Gell  Assistant: None  Anesthesia: General tracheal  Estimated blood loss: Minimal  Specimens: Brain biopsy  Complications: None  Drains: None  Description of procedure: The patient was brought to the operating room by the anesthesia team.  General endotracheal anesthesia was induced.  I applied the [place] three-point headrest to his calvarium.  He remained in supine position.  The patient's preoperative scan was previously loaded into the [employer] computer.  We formulated a entry point and trajectory for the biopsy.  I then entered the patient's surface coordinates into the [place] computer.  His right frontal scalp was then shaved with clippers and prepared with Betadine scrub and solution.  Sterile drapes were applied.  I then dusted the vario guide to the proper orientation.  I then injected the area to be incised with Marcaine with epinephrine solution.  I used a 15 blade scalpel to create a small incision in the patient's right frontal region at approximately the coronal suture.  I then used the TPS to create a small right frontal burr hole through the vario guide.  I then pierced the dura with the stylette.  I then inserted the biopsy needle through the variable guide into the patient's right frontal brain tumor.  We obtained 4 specimens and sent them to the pathologist.  I did not note any obvious bleeding.  I then removed the biopsy needle and the vario guide.  I then reapproximated  the patient's subcutaneous tissue with stainless steel staples.  The wound was then coated with bacitracin ointment.  A sterile drape was applied.  The drapes were removed.  I then remove the [place] three-point headrest from his calvarium.  By report all sponge, instrument, and needle counts were correct at the end of this case.

## 2021-06-13 NOTE — Anesthesia Procedure Notes (Signed)
Procedure Name: Intubation Date/Time: 06/13/2021 10:49 AM Performed by: Lance Coon, CRNA Pre-anesthesia Checklist: Patient identified, Emergency Drugs available, Suction available, Patient being monitored and Timeout performed Patient Re-evaluated:Patient Re-evaluated prior to induction Oxygen Delivery Method: Circle system utilized Preoxygenation: Pre-oxygenation with 100% oxygen Induction Type: IV induction Ventilation: Mask ventilation without difficulty Laryngoscope Size: Miller and 2 Grade View: Grade I Tube type: Oral Tube size: 7.5 mm Number of attempts: 1 Airway Equipment and Method: Stylet Placement Confirmation: ETT inserted through vocal cords under direct vision, positive ETCO2 and breath sounds checked- equal and bilateral Secured at: 22 cm Tube secured with: Tape Dental Injury: Teeth and Oropharynx as per pre-operative assessment

## 2021-06-13 NOTE — Anesthesia Postprocedure Evaluation (Signed)
Anesthesia Post Note  Patient: Robert Oconnell.  Procedure(s) Performed: RIGHT STEREOTACTIC BRAIN BIOPSY (Right: Head) APPLICATION OF CRANIAL NAVIGATION (Right)     Patient location during evaluation: PACU Anesthesia Type: General Level of consciousness: awake Pain management: pain level controlled Vital Signs Assessment: post-procedure vital signs reviewed and stable Respiratory status: spontaneous breathing, nonlabored ventilation, respiratory function stable and patient connected to nasal cannula oxygen Cardiovascular status: blood pressure returned to baseline and stable Postop Assessment: no apparent nausea or vomiting Anesthetic complications: no   No notable events documented.  Last Vitals:  Vitals:   06/13/21 1700 06/13/21 1800  BP: 135/68 135/65  Pulse: 62 64  Resp: 13 15  Temp:    SpO2: 94% 94%    Last Pain:  Vitals:   06/13/21 1600  TempSrc:   PainSc: 0-No pain                 Jammy Plotkin P Chestine [place]

## 2021-06-13 NOTE — H&P (Signed)
Subjective: The patient is a 75 year old right-handed white male who has presented with confusion, mental status changes, behavioral changes, etc.  He was worked up with a brain MRI which demonstrated a bifrontal lesion crossing the corpus callosum.  I discussed the various treatment options with the patient, his wife and daughter.  He has decided to proceed with stereotactic brain biopsy.  Past Medical History:  Diagnosis Date   Eosinophilic esophagitis 92/0100   dx thru EGD w/ bx by dr Lyndel Safe;  followed by allergy / asthma clinic last note in epic   Hematuria    since 04-05-2020 urine tinged red   History of gastroesophageal reflux (GERD)    History of kidney stones    Hyperlipidemia    Lower back pain    04-02-2020   Mild memory disturbance    neurologist--- dr Jannifer Franklin   Nocturia    OSA (obstructive sleep apnea)    per pt  used cpap until he lost weight, stated no longer needed it,  last used approx. 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   Urgency of urination     Past Surgical History:  Procedure Laterality Date   CATARACT EXTRACTION W/ INTRAOCULAR LENS  IMPLANT, BILATERAL  2010   CYSTOSCOPY W/ URETERAL STENT REMOVAL Right 04/14/2020   Procedure: CYSTOSCOPY WITH STENT REMOVAL;  Surgeon: [name], MD;  Location: [facility];  Service: Urology;  Laterality: Right;   CYSTOSCOPY WITH RETROGRADE PYELOGRAM, URETEROSCOPY AND STENT PLACEMENT Bilateral 03/24/2020   Procedure: CYSTOSCOPY WITH RETROGRADE PYELOGRAM, URETEROSCOPY AND STENT PLACEMENT STONE BASKET RETRIVAL;  Surgeon: [name], MD;  Location: [facility];  Service: Urology;  Laterality: Bilateral;  78 MINS   CYSTOSCOPY WITH RETROGRADE PYELOGRAM, URETEROSCOPY AND STENT PLACEMENT Left 04/14/2020   Procedure: CYSTOSCOPY WITH RETROGRADE PYELOGRAM, URETEROSCOPY AND STENT EXCHANGE STONE BASKET EXTRACTION;  Surgeon: [name], MD;  Location: [facility];   Service: Urology;  Laterality: Left;  1 HR   CYSTOSCOPY WITH STENT PLACEMENT Left 12/2010   ESOPHAGOGASTRODUODENOSCOPY (EGD) WITH ESOPHAGEAL DILATION  03/ 2017  dr Lyndel Safe   EXTRACORPOREAL SHOCK WAVE LITHOTRIPSY  02-27-2011;  06-29-2014  @[facility]    FOOT SURGERY Right 1990s   removal growth, benign per pt   HEMORRHOID SURGERY  2007 approx.   HOLMIUM LASER APPLICATION Bilateral 02/08/1974   Procedure: HOLMIUM LASER APPLICATION;  Surgeon: [name], MD;  Location: [facility];  Service: Urology;  Laterality: Bilateral;   TONSILLECTOMY AND ADENOIDECTOMY  age 20    Allergies  Allergen Reactions   Nabumetone Other (See Comments)    Blistered skin   Tizanidine Other (See Comments)    Hallcinations, agitation    Social History   Tobacco Use   Smoking status: Never   Smokeless tobacco: Never  Substance Use Topics   Alcohol use: No    Family History  Problem Relation Age of Onset   Cancer Mother    Cancer Father    Prior to Admission medications   Medication Sig Start Date End Date Taking? Authorizing Provider  aspirin EC 81 MG tablet Take 81 mg by mouth every evening. Swallow whole.   Yes [provider]  carboxymethylcellulose (REFRESH PLUS) 0.5 % SOLN Place 1-2 drops into both eyes in the morning.   Yes [provider]  cyanocobalamin (,VITAMIN B-12,) 1000 MCG/ML injection Inject 1,000 mcg into the muscle every 30 (thirty) days.   Yes [provider]  donepezil (ARICEPT) 10 MG tablet  Take 10 mg by mouth at bedtime.   Yes [provider]  simvastatin (ZOCOR) 20 MG tablet Take 20 mg by mouth at bedtime.  10/08/19  Yes [provider]  tamsulosin (FLOMAX) 0.4 MG CAPS capsule Take 0.4 mg by mouth at bedtime.    Yes [provider]  donepezil (ARICEPT) 5 MG tablet Take 1 tablet (5 mg total) by mouth at bedtime. Patient not taking: Reported on 06/09/2021 02/15/21   Suzzanne Cloud, NP     Review of Systems  Positive ROS:  As above  All other systems have been reviewed and were otherwise negative with the exception of those mentioned in the HPI and as above.  Objective: Vital signs in last 24 hours: Temp:  [98.1 F (36.7 C)] 98.1 F (36.7 C) (11/14 0752) Pulse Rate:  [57] 57 (11/14 0752) Resp:  [18] 18 (11/14 0752) BP: (128)/(91) 128/91 (11/14 0752) SpO2:  [92 %] 92 % (11/14 0752) Weight:  [86.2 kg] 86.2 kg (11/14 0752) Estimated body mass index is 29.76 kg/m as calculated from the following:   Height as of this encounter: 5\' 7"  (1.702 m).   Weight as of this encounter: 86.2 kg.   General Appearance: Alert Head: Normocephalic, without obvious abnormality, atraumatic Eyes: PERRL, conjunctiva/corneas clear, EOM's intact,    Ears: Normal  Throat: Normal  Neck: Supple, Back: unremarkable Lungs: Clear to auscultation bilaterally, respirations unlabored Heart: Regular rate and rhythm, no murmur, rub or gallop Abdomen: Soft, non-tender Extremities: Extremities normal, atraumatic, no cyanosis or edema Skin: unremarkable  NEUROLOGIC:   Mental status: alert and oriented, a bit confused motor Exam - grossly normal Sensory Exam - grossly normal Reflexes:  Coordination - grossly normal Gait - grossly normal Balance - grossly normal Cranial Nerves: I: smell Not tested  II: visual acuity  OS: Normal  OD: Normal   II: visual fields Full to confrontation  II: pupils Equal, round, reactive to light  III,VII: ptosis None  III,IV,VI: extraocular muscles  Full ROM  V: mastication Normal  V: facial light touch sensation  Normal  V,VII: corneal reflex  Present  VII: facial muscle function - upper  Normal  VII: facial muscle function - lower Normal  VIII: hearing Not tested  IX: soft palate elevation  Normal  IX,X: gag reflex Present  XI: trapezius strength  5/5  XI: sternocleidomastoid strength 5/5  XI: neck flexion strength  5/5  XII: tongue strength  Normal    Data Review Lab Results   Component Value Date   WBC 5.6 06/13/2021   HGB 14.4 06/13/2021   HCT 43.8 06/13/2021   MCV 90.7 06/13/2021   PLT 155 06/13/2021   Lab Results  Component Value Date   NA 139 06/13/2021   K 3.8 06/13/2021   CL 108 06/13/2021   CO2 24 06/13/2021   BUN 17 06/13/2021   CREATININE 1.06 06/13/2021   GLUCOSE 110 (H) 06/13/2021   No results found for: INR, PROTIME  Assessment/Plan: Brain tumor: I have discussed the situation with the patient and his family.  I reviewed his imaging studies with them and pointed out the abnormalities.  We have discussed the various treatment options including doing nothing, empiric treatment, stereotactic brain biopsy, etc.  I described a stereotactic brain biopsy.  We have discussed the risk, benefits, alternatives, expected postop course, and likelihood of achieving our goals with surgery.  I have answered all their questions.  He has decided proceed with surgery.   Ophelia Charter 06/13/2021  10:12 AM

## 2021-06-13 NOTE — Anesthesia Procedure Notes (Signed)
Arterial Line Insertion Start/End11/14/2022 10:10 AM, 06/13/2021 10:25 AM Performed by: Lance Coon, CRNA, CRNA  Preanesthetic checklist: patient identified, IV checked, site marked, risks and benefits discussed, surgical consent, monitors and equipment checked, pre-op evaluation, timeout performed and anesthesia consent Lidocaine 1% used for infiltration Right, radial was placed Catheter size: 20 G Hand hygiene performed , maximum sterile barriers used  and Seldinger technique used  Attempts: 2 Procedure performed without using ultrasound guided technique. Following insertion, dressing applied and Biopatch. Post procedure assessment: normal and unchanged  Patient tolerated the procedure well with no immediate complications.

## 2021-06-13 NOTE — Transfer of Care (Signed)
Immediate Anesthesia Transfer of Care Note  Patient: Robert Oconnell.  Procedure(s) Performed: RIGHT STEREOTACTIC BRAIN BIOPSY (Right: Head) APPLICATION OF CRANIAL NAVIGATION (Right)  Patient Location: PACU  Anesthesia Type:General  Level of Consciousness: drowsy and patient cooperative  Airway & Oxygen Therapy: Patient Spontanous Breathing  Post-op Assessment: Report given to RN and Post -op Vital signs reviewed and stable  Post vital signs: Reviewed and stable  Last Vitals:  Vitals Value Taken Time  BP 139/74 06/13/21 1255  Temp 36.7 C 06/13/21 1225  Pulse 74 06/13/21 1256  Resp 13 06/13/21 1256  SpO2 97 % 06/13/21 1256  Vitals shown include unvalidated device data.  Last Pain:  Vitals:   06/13/21 1240  TempSrc:   PainSc: 0-No pain         Complications: No notable events documented.

## 2021-06-13 NOTE — Progress Notes (Signed)
Received report from PACU that patient was only oriented to self. I found the same finding upon his arrival to the unit. Once family arrived, they informed me that prior to the surgery he was fully oriented (able to tell admission staff his address). His is unable to tell me the year, situation, place, president, or age at this time. Strength is +4/5 and equal in all 4 extremities. Pupils are 3 and brisk. He has full sensation in all extremities. Speech is clear and smile is symmetrical. Pain is 0/10. Viona Gilmore, NP made aware of finding. Thayer Ohm D

## 2021-06-13 NOTE — Progress Notes (Signed)
Dr. Arnoldo Morale notified of positive COVID result. He stated he would still like to proceed with the surgery.

## 2021-06-14 ENCOUNTER — Encounter (HOSPITAL_COMMUNITY): Payer: Self-pay | Admitting: Neurosurgery

## 2021-06-14 MED ORDER — DOCUSATE SODIUM 100 MG PO CAPS: 100.0000 mg | ORAL_CAPSULE | Freq: Two times a day (BID) | ORAL | 0 refills | Status: AC

## 2021-06-14 MED ORDER — PANTOPRAZOLE SODIUM 40 MG PO TBEC: 40.0000 mg | DELAYED_RELEASE_TABLET | Freq: Every day | ORAL | Status: DC

## 2021-06-14 MED ORDER — DEXAMETHASONE 6 MG PO TABS: 3.0000 mg | ORAL_TABLET | Freq: Three times a day (TID) | ORAL | 0 refills | Status: AC

## 2021-06-14 MED ORDER — HYDROCODONE-ACETAMINOPHEN 5-325 MG PO TABS: 1.0000 | ORAL_TABLET | ORAL | 0 refills | Status: AC | PRN

## 2021-06-14 NOTE — Discharge Instructions (Addendum)
Wound Care   Do not put any creams, lotions, or ointments on incision. Leave incision open to air. You are fine to shower.  Let water run over the surgical incision and pat dry. Staples will be removed at the 1st postoperative appointment.  Activity Walk each and every day, increasing distance each day. No lifting greater than 5 lbs. No driving for 2 weeks; may ride as a passenger locally.  Diet Resume your normal diet.   Return to Work Will be discussed at your follow up appointment.  Call Your Doctor If Any of These Occur Redness, drainage, or swelling at the wound.  Temperature greater than 101 degrees. Severe pain not relieved by pain medication. Incision starts to come apart.

## 2021-06-14 NOTE — Progress Notes (Signed)
Subjective: The patient is alert and pleasant.  He is in no Distress.  He is mildly confused.  His wife is at the bedside.  Objective: Vital signs in last 24 hours: Temp:  [98 F (36.7 C)-98.8 F (37.1 C)] 98.8 F (37.1 C) (11/15 0300) Pulse Rate:  [52-81] 54 (11/15 0600) Resp:  [9-18] 12 (11/15 0600) BP: (120-147)/(62-91) 126/64 (11/15 0600) SpO2:  [91 %-96 %] 95 % (11/15 0600) Arterial Line BP: (97-152)/(54-121) 106/92 (11/15 0100) Weight:  [86.2 kg] 86.2 kg (11/14 0752) Estimated body mass index is 29.76 kg/m as calculated from the following:   Height as of this encounter: 5\' 7"  (1.702 m).   Weight as of this encounter: 86.2 kg.   Intake/Output from previous day: 11/14 0701 - 11/15 0700 In: 2912.8 [P.O.:65; I.V.:2647.8; IV Piggyback:200] Out: 1300 [Urine:1200; Blood:100] Intake/Output this shift: No intake/output data recorded.  Physical exam the patient is alert and oriented times normal person.  He is mildly confused.  His speech and strength is normal.  I reviewed recent CT performed this morning.  It demonstrates expected postoperative findings.  There is a tiny amount of blood.    Lab Results: Recent Labs    06/13/21 0850  WBC 5.6  HGB 14.4  HCT 43.8  PLT 155   BMET Recent Labs    06/13/21 0850  NA 139  K 3.8  CL 108  CO2 24  GLUCOSE 110*  BUN 17  CREATININE 1.06  CALCIUM 8.6*    Studies/Results: CT HEAD WO CONTRAST (5MM)  Result Date: 06/13/2021 CLINICAL DATA:  Brain biopsy today with confusion and mental status changes, behavioral changes. EXAM: CT HEAD WITHOUT CONTRAST TECHNIQUE: Contiguous axial images were obtained from the base of the skull through the vertex without intravenous contrast. COMPARISON:  MRI without and with contrast 3 days ago 06/10/2021 FINDINGS: Brain: Again noted is bifrontal mass, centered in the genu and anterior body of the corpus callosum with invasion into both frontal lobes and measuring 5.8 x 3.1 cm on axial image  19, with areas of internal necrosis primarily on the left. There is new demonstration of a drill tract through the right frontal bone presumably from interval biopsy with a few small air droplets in the right frontal parenchyma leading down to the mass, with a small underlying right frontal subarachnoid hemorrhage associated with the biopsy and trace subarachnoid air in the area. There is a small cortical contusion in this region as well. No other focal parenchymal abnormalities seen bilaterally aside from mild small vessel disease of the cerebral white matter and mild cortical atrophy in the cerebrum and cerebellum. There is no midline shift or positive mass effect associated the above. The ventricles again noted partially effaced in the frontal horns by the tumor but otherwise are patent. Vascular: No hyperdense vessel or unexpected calcification. Skull: As above.  The calvarium is intact aside from the drill hole. Sinuses/Orbits: Evidence of prior lens extractions. There is mild membrane thickening in the sinuses without fluid level mastoid effusion Other: None. IMPRESSION: Small right frontal cortical contusion and subarachnoid bleed post biopsy, with minimal right subarachnoid air and scattered parenchymal air in the right frontal lobe leading down to the tumor. 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 Electronically Signed   By: Telford Nab M.D.   On: 06/13/2021 23:05    Assessment/Plan: Postop day #1: The patient is doing well.  We will DC his A-line and Foley, Hep-Lock IV, and mobilize the patient.  If he does  well  and urinates he can go home today.  He has follow-up in a week to go over his pathology remove the staples.  I have answered all the questions.  LOS: 1 day     Ophelia Charter 06/14/2021, 7:47 AM     Patient ID: Robert Oconnell., male   DOB: 07/30/1946, 75 y.o.   MRN: [id number]

## 2021-06-14 NOTE — Progress Notes (Addendum)
Went to take out foley catheter and patient was guarding and unwilling to let me uncover him to take out Foley.  This was despite multiple attempts to explain to patient that he would not be able to be discharged if he didn't let me take out catheter.  Will bring male RN in room with me and see if we are able to persuade him to take out catheter.    Sarah Hocutt RN, was able to help me remove the foley from the patient.  Afterwards he got up from bed and got into the chair for breakfast.

## 2021-06-14 NOTE — Discharge Summary (Signed)
Physician Discharge Summary     Providing Compassionate, Quality Care - Together   Patient ID: Robert Oconnell. MRN: [id number] DOB/AGE: 10-01-1945 75 y.o.  Admit date: 06/13/2021 Discharge date: 06/14/2021  Admission Diagnoses: Brain tumor  Discharge Diagnoses:  Active Problems:   Status post stereotactic brain biopsy   Brain tumor [facility])   Discharged Condition: good  Hospital Course: Patient underwent a stereotactic brain biopsy by Dr. Arnoldo Morale on 06/13/2021. He was admitted to 4N19 following recovery from anesthesia in the PACU. His postoperative course has been uncomplicated. He is ambulating independently and without difficulty. He is tolerating a normal diet. He is not having any bowel or bladder dysfunction. His pain is well-controlled with oral pain medication. He is ready for discharge home.   Consults: None  Significant Diagnostic Studies: labs:  Results for orders placed or performed during the hospital encounter of 06/13/21 (from the past 72 hour(s))  SARS Coronavirus 2 by RT PCR (hospital order, performed in [facility] hospital lab) Nasopharyngeal Nasopharyngeal Swab     Status: Abnormal   Collection Time: 06/13/21  7:43 AM   Specimen: Nasopharyngeal Swab  Result Value Ref Range   SARS Coronavirus 2 POSITIVE (A) NEGATIVE    Comment: RESULT CALLED TO, READ BACK BY AND VERIFIED WITH: RN H BOWMAN [id number] AT 917 AM BY CM (NOTE) SARS-CoV-2 target nucleic acids are DETECTED  SARS-CoV-2 RNA is generally detectable in upper respiratory specimens  during the acute phase of infection.  Positive results are indicative  of the presence of the identified virus, but do not rule out bacterial infection or co-infection with other pathogens not detected by the test.  Clinical correlation with patient history and  other diagnostic information is necessary to determine patient infection status.  The expected result is negative.  Fact Sheet for Patients:    StrictlyIdeas.no   Fact Sheet for Healthcare Providers:   BankingDealers.co.za    This test is not yet approved or cleared by the [place] FDA and  has been authorized for detection and/or diagnosis of SARS-CoV-2 by FDA under an Emergency Use Authorization (EUA).  This EUA will remain in effect (meaning this te st can be used) for the duration of  the COVID-19 declaration under Section 564(b)(1) of the Act, 21 U.S.C. section 360-bbb-3(b)(1), unless the authorization is terminated or revoked sooner.  Performed at [facility], [place] [address], [place], [place] [address]   CBC per protocol     Status: None   Collection Time: 06/13/21  8:50 AM  Result Value Ref Range   WBC 5.6 4.0 - 10.5 K/uL   RBC 4.83 4.22 - 5.81 MIL/uL   Hemoglobin 14.4 13.0 - 17.0 g/dL   HCT 43.8 39.0 - 52.0 %   MCV 90.7 80.0 - 100.0 fL   MCH 29.8 26.0 - 34.0 pg   MCHC 32.9 30.0 - 36.0 g/dL   RDW 13.4 11.5 - 15.5 %   Platelets 155 150 - 400 K/uL   nRBC 0.0 0.0 - 0.2 %    Comment: Performed at [facility], [place] [address] [place], [place] [address]  Basic metabolic panel per protocol     Status: Abnormal   Collection Time: 06/13/21  8:50 AM  Result Value Ref Range   Sodium 139 135 - 145 mmol/L   Potassium 3.8 3.5 - 5.1 mmol/L   Chloride 108 98 - 111 mmol/L   CO2 24 22 - 32 mmol/L   Glucose, Bld 110 (H) 70 -  99 mg/dL    Comment: Glucose reference range applies only to samples taken after fasting for at least 8 hours.   BUN 17 8 - 23 mg/dL   Creatinine, Ser 1.06 0.61 - 1.24 mg/dL   Calcium 8.6 (L) 8.9 - 10.3 mg/dL   GFR, Estimated >60 >60 mL/min    Comment: (NOTE) Calculated using the CKD-EPI Creatinine Equation (2021)    Anion gap 7 5 - 15    Comment: Performed at [place] [address] [place], [place] [address]  MRSA Next Gen by PCR, Nasal     Status: None   Collection Time: 06/13/21  1:56 PM   Specimen: Nasal Mucosa;  Nasal Swab  Result Value Ref Range   MRSA by PCR Next Gen NOT DETECTED NOT DETECTED    Comment: (NOTE) The GeneXpert MRSA Assay (FDA approved for NASAL specimens only), is one component of a comprehensive MRSA colonization surveillance program. It is not intended to diagnose MRSA infection nor to guide or monitor treatment for MRSA infections. Test performance is not FDA approved in patients less than 75 years old. Performed at [facility], [place] [address] [place], [place] [address]       and radiology: CT HEAD WO CONTRAST (5MM)  Result Date: 06/13/2021 CLINICAL DATA:  Brain biopsy today with confusion and mental status changes, behavioral changes. EXAM: CT HEAD WITHOUT CONTRAST TECHNIQUE: Contiguous axial images were obtained from the base of the skull through the vertex without intravenous contrast. COMPARISON:  MRI without and with contrast 3 days ago 06/10/2021 FINDINGS: Brain: Again noted is bifrontal mass, centered in the genu and anterior body of the corpus callosum with invasion into both frontal lobes and measuring 5.8 x 3.1 cm on axial image 19, with areas of internal necrosis primarily on the left. There is new demonstration of a drill tract through the right frontal bone presumably from interval biopsy with a few small air droplets in the right frontal parenchyma leading down to the mass, with a small underlying right frontal subarachnoid hemorrhage associated with the biopsy and trace subarachnoid air in the area. There is a small cortical contusion in this region as well. No other focal parenchymal abnormalities seen bilaterally aside from mild small vessel disease of the cerebral white matter and mild cortical atrophy in the cerebrum and cerebellum. There is no midline shift or positive mass effect associated the above. The ventricles again noted partially effaced in the frontal horns by the tumor but otherwise are patent. Vascular: No hyperdense vessel or unexpected  calcification. Skull: As above.  The calvarium is intact aside from the drill hole. Sinuses/Orbits: Evidence of prior lens extractions. There is mild membrane thickening in the sinuses without fluid level mastoid effusion Other: None. IMPRESSION: Small right frontal cortical contusion and subarachnoid bleed post biopsy, with minimal right subarachnoid air and scattered parenchymal air in the right frontal lobe leading down to the tumor. 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 Electronically Signed   By: Telford Nab M.D.   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 CONTRAST:  65mL GADAVIST GADOBUTROL 1 MMOL/ML IV SOLN COMPARISON:  MRI June 06, 2021. FINDINGS: Brain: Redemonstrated heterogeneous T2 hyperintense mass in bilateral frontal lobes, crossing expanding  the genu of the corpus callosum. The mass measures up to approximately 3.1 x 5.5 cm, similar in size. Similar areas of cystic change and expansion. Extensive enhancement with areas of necrosis. The degree of enhancement appears increased in comparison to recent prior. The mass demonstrates internal restricted diffusion, which is mild in compatible with hypercellularity. Similar small foci of restricted diffusion within the posterior body of the corpus callosum and deep white matter of the right frontal lobe. No hydrocephalus, extra-axial fluid collection, or acute hemorrhage. Mild patchy white matter T2 hyperintensities, nonspecific but compatible with chronic microvascular ischemic disease. Vascular: Major arterial flow voids are maintained at the skull base. Skull and upper cervical spine: Normal marrow signal. Sinuses/Orbits: Mild paranasal sinus  mucosal thickening. Unremarkable orbits. Other: No mastoid effusions. IMPRESSION: 1. 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 Increased conspicuity of enhancement, which may be due to differences in technique given short interval of follow up although an element of tumor progression is possible. 2. Similar small foci of restricted diffusion within the posterior body of the corpus callosum and deep white matter of the right frontal lobe, potentially recent infarcts versus multifocal tumor. Electronically Signed   By: Margaretha Sheffield M.D.   On: 06/10/2021 15:40     Treatments: surgery: Stereotactic right frontal brain biopsy using [facility]  Discharge Exam: Blood pressure 118/68, pulse 93, temperature 98.2 F (36.8 C), temperature source Oral, resp. rate 19, height 5\' 7"  (1.702 m), weight 86.2 kg, SpO2 95 %   Physical exam the patient is alert and oriented times normal person.  He is mildly confused.  His speech and strength is normal.  Disposition: Discharge disposition: 01-Home or Self Care        Allergies as of 06/14/2021       Reactions   Nabumetone Other (See Comments)   Blistered skin   Tizanidine Other (See Comments)   Hallcinations, agitation        Medication List     TAKE these medications    aspirin EC 81 MG tablet Take 81 mg by mouth every evening. Swallow whole.   carboxymethylcellulose 0.5 % Soln Commonly known as: REFRESH PLUS Place 1-2 drops into both eyes in the morning.   cyanocobalamin 1000 MCG/ML injection Commonly known as: (VITAMIN B-12) Inject 1,000 mcg into the muscle every 30 (thirty) days.   dexamethasone 6 MG tablet Commonly known as: DECADRON Take 0.5 tablets (3 mg total) by mouth 3 (three) times daily for 3 days.   docusate sodium 100 MG capsule Commonly known as:  COLACE Take 1 capsule (100 mg total) by mouth 2 (two) times daily.   donepezil 10 MG tablet Commonly known as: ARICEPT Take 10 mg by mouth at bedtime. What changed: Another medication with the same name was removed. Continue taking this medication, and follow the directions you see here.   HYDROcodone-acetaminophen 5-325 MG tablet Commonly known as: NORCO/VICODIN Take 1 tablet by mouth every 4 (four) hours as needed for moderate pain.   simvastatin 20 MG tablet Commonly known as: ZOCOR Take 20 mg by mouth at bedtime.   tamsulosin 0.4 MG Caps capsule Commonly known as: FLOMAX Take 0.4 mg by mouth at bedtime.        Follow-up Information     Newman Pies, MD. Go on 06/21/2021.   Specialty: Neurosurgery Why: Appointment is at 4:30 pm. Jodell Cipro will be removed  at this appointment. Contact information: [address] [place] [address] [phone]                 Signed: Viona Gilmore, DNP, AGNP-C Nurse Practitioner  [facility] [place] [address], [place], [place], [place] [address] P: [phone]    F: [phone]  06/14/2021, 1:32 PM

## 2021-06-14 NOTE — Progress Notes (Signed)
Results of CT scan obtained on 06/13/21 at 2235 called and reported to Arnetha Massy NP per the radiologist request and call on 06/14/21 at 0315.Patient resting and is without complaints. No changes on neuro exam. Wife at bedside.

## 2021-06-14 NOTE — Progress Notes (Signed)
Patient and patient's wife have received and been informed on all discharge instructions.  Ivs were removed and patient's belongings were gathered by wife.  The patient was wheeled down to the main entrance where his daughter picked him up in her car.

## 2021-06-20 ENCOUNTER — Inpatient Hospital Stay: Payer: Medicare PPO | Attending: Internal Medicine

## 2021-06-20 DIAGNOSIS — C718 Malignant neoplasm of overlapping sites of brain: Secondary | ICD-10-CM | POA: Insufficient documentation

## 2021-06-20 DIAGNOSIS — Z7982 Long term (current) use of aspirin: Secondary | ICD-10-CM | POA: Insufficient documentation

## 2021-06-20 DIAGNOSIS — Z79899 Other long term (current) drug therapy: Secondary | ICD-10-CM | POA: Insufficient documentation

## 2021-06-21 ENCOUNTER — Inpatient Hospital Stay: Payer: Medicare PPO | Admitting: Internal Medicine

## 2021-06-21 ENCOUNTER — Other Ambulatory Visit: Payer: Self-pay

## 2021-06-21 VITALS — BP 107/68 | HR 68 | Temp 97.7°F | Resp 18 | Ht 67.0 in | Wt 199.4 lb

## 2021-06-21 DIAGNOSIS — Z7982 Long term (current) use of aspirin: Secondary | ICD-10-CM | POA: Diagnosis not present

## 2021-06-21 DIAGNOSIS — R413 Other amnesia: Secondary | ICD-10-CM

## 2021-06-21 DIAGNOSIS — R718 Other abnormality of red blood cells: Secondary | ICD-10-CM | POA: Diagnosis not present

## 2021-06-21 DIAGNOSIS — D496 Neoplasm of unspecified behavior of brain: Secondary | ICD-10-CM

## 2021-06-21 DIAGNOSIS — Z79899 Other long term (current) drug therapy: Secondary | ICD-10-CM | POA: Diagnosis not present

## 2021-06-21 DIAGNOSIS — C718 Malignant neoplasm of overlapping sites of brain: Secondary | ICD-10-CM | POA: Diagnosis not present

## 2021-06-21 NOTE — Progress Notes (Signed)
[facility] [place], [place] [address] [phone]   New Patient Evaluation  Date of Service: 06/21/21 Patient Name: Robert Oconnell. Patient MRN: [id number] Patient DOB: 04-24-1946 Provider: Ventura Sellers, MD  Identifying Statement:  Robert Oconnell. is a 75 y.o. male with  bifrontal   high grade glioma  who presents for initial consultation and evaluation.    Referring Provider: Angelina Sheriff, MD [place],  [place] [address]  Oncologic History: 06/13/21: Stereotactic biopsy with Dr. Arnoldo Morale; prelim path is high grade glioma, pending final   Biomarkers:  MGMT Unknown.  IDH 1/2 Unknown.  EGFR Unknown  TERT Unknown   History of Present Illness: The patient's records from the referring physician were obtained and reviewed and the patient interviewed to confirm this HPI.  Robert Oconnell. Presented to medical attention this past month with several weeks of progressive confusion.  Family describes impaired memory, poor insight, incorrect decision making and planning.  They also describe hallucinations, none of which have been unpleasant or leading to any harm.  CNS imaging demonstrated large frontal lobe mass, he then underwent biopsy with Dr. Arnoldo Morale on 06/13/21.  Since surgery, he has no new complaints.  He walks around the home without assistance, and performs all ADLs generally independently.  He is off all steroids.  Family has noted progression of confusion and impaired insight, and recently had an episode of urinary incontinence.    Medications: Current Outpatient Medications on File Prior to Visit  Medication Sig Dispense Refill   aspirin EC 81 MG tablet Take 81 mg by mouth every evening. Swallow whole. (Patient not taking: Reported on 06/21/2021)     carboxymethylcellulose (REFRESH PLUS) 0.5 % SOLN Place 1-2 drops into both eyes in the morning.     cyanocobalamin (,VITAMIN B-12,) 1000 MCG/ML injection  Inject 1,000 mcg into the muscle every 30 (thirty) days.     docusate sodium (COLACE) 100 MG capsule Take 1 capsule (100 mg total) by mouth 2 (two) times daily. 10 capsule 0   donepezil (ARICEPT) 10 MG tablet Take 10 mg by mouth at bedtime.     HYDROcodone-acetaminophen (NORCO/VICODIN) 5-325 MG tablet Take 1 tablet by mouth every 4 (four) hours as needed for moderate pain. (Patient not taking: Reported on 06/21/2021) 30 tablet 0   simvastatin (ZOCOR) 20 MG tablet Take 20 mg by mouth at bedtime.      tamsulosin (FLOMAX) 0.4 MG CAPS capsule Take 0.4 mg by mouth at bedtime.      No current facility-administered medications on file prior to visit.    Allergies:  Allergies  Allergen Reactions   Nabumetone Other (See Comments)    Blistered skin   Tizanidine Other (See Comments)    Hallcinations, agitation   Past Medical History:  Past Medical History:  Diagnosis Date   Eosinophilic esophagitis 39/0300   dx thru EGD w/ bx by dr Lyndel Safe;  followed by allergy / asthma clinic last note in epic   Hematuria    since 04-05-2020 urine tinged red   History of gastroesophageal reflux (GERD)    History of kidney stones    Hyperlipidemia    Lower back pain    04-02-2020   Mild memory disturbance    neurologist--- dr Jannifer Franklin   Nocturia    OSA (obstructive sleep apnea)    per pt  used cpap until he lost weight, stated no longer needed it,  last  used approx. 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   Urgency of urination    Past Surgical History:  Past Surgical History:  Procedure Laterality Date   APPLICATION OF CRANIAL NAVIGATION Right 06/13/2021   Procedure: APPLICATION OF CRANIAL NAVIGATION;  Surgeon: Newman Pies, MD;  Location: [facility];  Service: Neurosurgery;  Laterality: Right;   CATARACT EXTRACTION W/ INTRAOCULAR LENS  IMPLANT, BILATERAL  2010   CYSTOSCOPY W/ URETERAL STENT REMOVAL Right 04/14/2020   Procedure: CYSTOSCOPY WITH STENT REMOVAL;  Surgeon: [name],  MD;  Location: [facility];  Service: Urology;  Laterality: Right;   CYSTOSCOPY WITH RETROGRADE PYELOGRAM, URETEROSCOPY AND STENT PLACEMENT Bilateral 03/24/2020   Procedure: CYSTOSCOPY WITH RETROGRADE PYELOGRAM, URETEROSCOPY AND STENT PLACEMENT STONE BASKET RETRIVAL;  Surgeon: [name], MD;  Location: [facility];  Service: Urology;  Laterality: Bilateral;  82 MINS   CYSTOSCOPY WITH RETROGRADE PYELOGRAM, URETEROSCOPY AND STENT PLACEMENT Left 04/14/2020   Procedure: CYSTOSCOPY WITH RETROGRADE PYELOGRAM, URETEROSCOPY AND STENT EXCHANGE STONE BASKET EXTRACTION;  Surgeon: [name], MD;  Location: [facility];  Service: Urology;  Laterality: Left;  1 HR   CYSTOSCOPY WITH STENT PLACEMENT Left 12/2010   ESOPHAGOGASTRODUODENOSCOPY (EGD) WITH ESOPHAGEAL DILATION  03/ 2017  dr Lyndel Safe   EXTRACORPOREAL SHOCK WAVE LITHOTRIPSY  02-27-2011;  06-29-2014  _0    FOOT SURGERY Right 1990s   removal growth, benign per pt   FRAMELESS  BIOPSY WITH BRAINLAB Right 06/13/2021   Procedure: RIGHT STEREOTACTIC BRAIN BIOPSY;  Surgeon: Newman Pies, MD;  Location: [facility];  Service: Neurosurgery;  Laterality: Right;   HEMORRHOID SURGERY  2007 approx.   HOLMIUM LASER APPLICATION Bilateral 9/76/7341   Procedure: HOLMIUM LASER APPLICATION;  Surgeon: [name], MD;  Location: [facility];  Service: Urology;  Laterality: Bilateral;   TONSILLECTOMY AND ADENOIDECTOMY  age 89   Social History:  Social History   Socioeconomic History   Marital status: Married    Spouse name: Not on file   Number of children: Not on file   Years of education: Not on file   Highest education level: Not on file  Occupational History   Not on file  Tobacco Use   Smoking status: Never   Smokeless tobacco: Never  Vaping Use   Vaping Use: Never used  Substance and Sexual Activity   Alcohol use: No   Drug use: Never   Sexual activity: Not on file  Other Topics  Concern   Not on file  Social History Narrative   Not on file   Social Determinants of Health   Financial Resource Strain: Not on file  Food Insecurity: Not on file  Transportation Needs: Not on file  Physical Activity: Not on file  Stress: Not on file  Social Connections: Not on file  Intimate Partner Violence: Not on file   Family History:  Family History  Problem Relation Age of Onset   Cancer Mother    Cancer Father     Review of Systems: Constitutional: Doesn't report fevers, chills or abnormal weight loss Eyes: Doesn't report blurriness of vision Ears, nose, mouth, throat, and face: Doesn't report sore throat Respiratory: Doesn't report cough, dyspnea or wheezes Cardiovascular: Doesn't report palpitation, chest discomfort  Gastrointestinal:  Doesn't report nausea, constipation, diarrhea GU: Doesn't report incontinence Skin: Doesn't report skin rashes Neurological: Per HPI Musculoskeletal: Doesn't report joint pain Behavioral/Psych: Doesn't report anxiety  Physical Exam: Vitals:   06/21/21 1039  BP: 107/68  Pulse: 68  Resp: 18  Temp: 97.7 F (36.5 C)  SpO2: 98%   KPS: 70. General: Alert, cooperative, pleasant, in no acute distress Head: Normal EENT: No conjunctival injection or scleral icterus.  Lungs: Resp effort normal Cardiac: Regular rate Abdomen: Non-distended abdomen Skin: No rashes cyanosis or petechiae. Extremities: No clubbing or edema  Neurologic Exam: Mental Status: Awake, alert, attentive to examiner. Oriented to self and environment. Language is fluent with intact comprehension.  Elements of agnosia, abulia, and situational neglect which impair insight into current condition (I'm here for a problem with my skin) Cranial Nerves: Visual acuity is grossly normal. Visual fields are full. Extra-ocular movements intact. No ptosis. Face is symmetric Motor: Tone and bulk are normal. Power is full in both arms and legs. Reflexes are symmetric, no  pathologic reflexes present.  Sensory: Intact to light touch Gait: Normal.   Labs: I have reviewed the data as listed    Component Value Date/Time   NA 139 06/13/2021 0850   K 3.8 06/13/2021 0850   CL 108 06/13/2021 0850   CO2 24 06/13/2021 0850   GLUCOSE 110 (H) 06/13/2021 0850   BUN 17 06/13/2021 0850   CREATININE 1.06 06/13/2021 0850   CALCIUM 8.6 (L) 06/13/2021 0850   GFRNONAA >60 06/13/2021 0850   Lab Results  Component Value Date   WBC 5.6 06/13/2021   HGB 14.4 06/13/2021   HCT 43.8 06/13/2021   MCV 90.7 06/13/2021   PLT 155 06/13/2021    Imaging:  CT HEAD WO CONTRAST (5MM)  Result Date: 06/13/2021 CLINICAL DATA:  Brain biopsy today with confusion and mental status changes, behavioral changes. EXAM: CT HEAD WITHOUT CONTRAST TECHNIQUE: Contiguous axial images were obtained from the base of the skull through the vertex without intravenous contrast. COMPARISON:  MRI without and with contrast 3 days ago 06/10/2021 FINDINGS: Brain: Again noted is bifrontal mass, centered in the genu and anterior body of the corpus callosum with invasion into both frontal lobes and measuring 5.8 x 3.1 cm on axial image 19, with areas of internal necrosis primarily on the left. There is new demonstration of a drill tract through the right frontal bone presumably from interval biopsy with a few small air droplets in the right frontal parenchyma leading down to the mass, with a small underlying right frontal subarachnoid hemorrhage associated with the biopsy and trace subarachnoid air in the area. There is a small cortical contusion in this region as well. No other focal parenchymal abnormalities seen bilaterally aside from mild small vessel disease of the cerebral white matter and mild cortical atrophy in the cerebrum and cerebellum. There is no midline shift or positive mass effect associated the above. The ventricles again noted partially effaced in the frontal horns by the tumor but otherwise are  patent. Vascular: No hyperdense vessel or unexpected calcification. Skull: As above.  The calvarium is intact aside from the drill hole. Sinuses/Orbits: Evidence of prior lens extractions. There is mild membrane thickening in the sinuses without fluid level mastoid effusion Other: None. IMPRESSION: Small right frontal cortical contusion and subarachnoid bleed post biopsy, with minimal right subarachnoid air and scattered parenchymal air in the right frontal lobe leading down to the tumor. 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 Electronically Signed   By: Telford Nab M.D.   On: 06/13/2021 23:05   MR BRAIN W WO CONTRAST  Result Date: 06/10/2021 CLINICAL DATA:  Malignant neoplasm of overlapping sites of brain ([place]) C71.8 (ICD-10-CM) EXAM: MRI HEAD WITHOUT AND WITH CONTRAST  TECHNIQUE: Multiplanar, multiecho pulse sequences of the brain and surrounding structures were obtained without and with intravenous contrast. CONTRAST:  1m GADAVIST GADOBUTROL 1 MMOL/ML IV SOLN COMPARISON:  MRI June 06, 2021. FINDINGS: Brain: Redemonstrated heterogeneous T2 hyperintense mass in bilateral frontal lobes, crossing expanding the genu of the corpus callosum. The mass measures up to approximately 3.1 x 5.5 cm, similar in size. Similar areas of cystic change and expansion. Extensive enhancement with areas of necrosis. The degree of enhancement appears increased in comparison to recent prior. The mass demonstrates internal restricted diffusion, which is mild in compatible with hypercellularity. Similar small foci of restricted diffusion within the posterior body of the corpus callosum and deep white matter of the right frontal lobe. No hydrocephalus, extra-axial fluid collection, or acute hemorrhage. Mild patchy white matter T2 hyperintensities, nonspecific but compatible with chronic microvascular ischemic disease. Vascular: Major arterial flow voids are maintained at the skull base. Skull and upper cervical spine: Normal  marrow signal. Sinuses/Orbits: Mild paranasal sinus mucosal thickening. Unremarkable orbits. Other: No mastoid effusions. IMPRESSION: 1. 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 Increased conspicuity of enhancement, which may be due to differences in technique given short interval of follow up although an element of tumor progression is possible. 2. Similar small foci of restricted diffusion within the posterior body of the corpus callosum and deep white matter of the right frontal lobe, potentially recent infarcts versus multifocal tumor. Electronically Signed   By: FMargaretha SheffieldM.D.   On: 06/10/2021 15:40    Pathology: pending final (sendout)  Assessment/Plan Brain tumor ([facility]  Memory disorder  We appreciate the opportunity to participate in the care of O[place].Marland Kitchen He presents today with clinical and radiographic syndrome localizing to bilateral mesial frontal lobes, secondary to (suspected) high grade glioma or glioblastoma.  Confidence of this assessment, despite lack of finalized path, is based on preliminary path report and MRI appearance.  We had an extensive conversation with him and his family regarding pathology, prognosis, and available treatment pathways for high grade glioma.  His prognosis is limited by large burden of unresectable disease in eloquent region, impaired insight/cognition, as well as age and age related comorbidity.  We provided options for treatment, including full course of 6 weeks radiochemotherapy, abridged 3 weeks radiation therapy with or without chemo, and hospice/palliative only.    If full scope of treatment is desired, we recommended proceeding with course of intensity modulated radiation therapy and concurrent daily Temozolomide.  Radiation will be administered Mon-Fri over 6 weeks, Temodar  will be dosed at 766mm2 to be given daily over 42 days.  We reviewed side effects of temodar, including fatigue, nausea/vomiting, constipation, and cytopenias.  Chemotherapy whould be held for the following:  ANC less than 1,000  Platelets less than 100,000  LFT or creatinine greater than 2x ULN  If clinical concerns/contraindications develop  Every 2 weeks during radiation, labs would be checked accompanied by a clinical evaluation in the brain tumor clinic.  Screening for potential clinical trials was performed and discussed using eligibility criteria for active protocols at [facility], as well as national database available on [email or web address]   The patient is not a candidate for a research protocol at this time due to  path pending .   We spent twenty additional minutes teaching regarding the natural history, biology, and historical experience  in the treatment of brain tumors. We then discussed in detail the current recommendations for therapy focusing on the mode of administration, mechanism of action, anticipated toxicities, and quality of life issues associated with this plan. We also provided teaching sheets for the patient to take home as an additional resource.  Higinio Grow Jr.and his family will take some time this weekend to discuss and review their options, then they will reach out to [place] via phone or my-chart.  All questions were answered. The patient knows to call the clinic with any problems, questions or concerns. No barriers to learning were detected.  The total time spent in the encounter was 60 minutes and more than 50% was on counseling and review of test results   Ventura Sellers, MD Medical Director of Neuro-Oncology [facility] [place] 06/21/21 12:33 PM

## 2021-06-23 ENCOUNTER — Other Ambulatory Visit: Payer: Self-pay | Admitting: Internal Medicine

## 2021-06-23 MED ORDER — LEVETIRACETAM 1000 MG PO TABS: 1000.0000 mg | ORAL_TABLET | Freq: Two times a day (BID) | ORAL | 3 refills | Status: AC

## 2021-06-23 MED ORDER — DEXAMETHASONE 4 MG PO TABS: 4.0000 mg | ORAL_TABLET | Freq: Two times a day (BID) | ORAL | 2 refills | Status: AC

## 2021-06-27 ENCOUNTER — Telehealth: Payer: Self-pay | Admitting: *Deleted

## 2021-06-27 NOTE — Telephone Encounter (Signed)
Follow up call to daughter after receiving after  hours phone notification from 06/23/2021.  States patient had dramatic decline Wednesday after being seen in our office.  States they called [facility].    Advised that we will be available in any capacity to help along side of hospice.

## 2021-06-28 ENCOUNTER — Telehealth: Payer: Self-pay | Admitting: Neurology

## 2021-06-28 NOTE — Telephone Encounter (Signed)
Pt's daughter, Caryl Pina called, cancelled appt, pt has been admitted to Hospice.

## 2021-07-06 ENCOUNTER — Encounter (HOSPITAL_COMMUNITY): Payer: Self-pay

## 2021-07-06 LAB — SURGICAL PATHOLOGY

## 2021-07-31 DEATH — deceased

## 2021-08-23 ENCOUNTER — Ambulatory Visit: Payer: Medicare PPO | Admitting: Neurology

## 2023-01-09 IMAGING — MR MR HEAD WO/W CM
13 of 14 series · 42 of 48 positions shown · IV contrast (gadavist)
Comparison: MRI June 06, 2021.

CLINICAL DATA: Malignant neoplasm of overlapping sites of brain
(H[facility]) 5XL.A (IPO-Q4-CM)

EXAM:
MRI HEAD WITHOUT AND WITH CONTRAST
TECHNIQUE: Multiplanar, multiecho pulse sequences of the brain and surrounding
structures were obtained without and with intravenous contrast.
CONTRAST:  9mL GADAVIST GADOBUTROL 1 MMOL/ML IV SOLN

[Series 2: FLAIR · sagittal · 3.0mm · 0.47mm/px · 1 of 38 slices shown (1 of 2)]
[im 1/38]
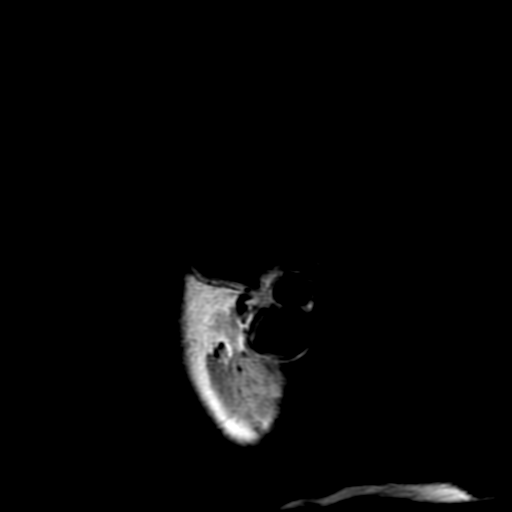

[Series 3: T2 · axial · 5.0mm · 0.43mm/px · 1 of 27 slices shown]
[im 1/27]
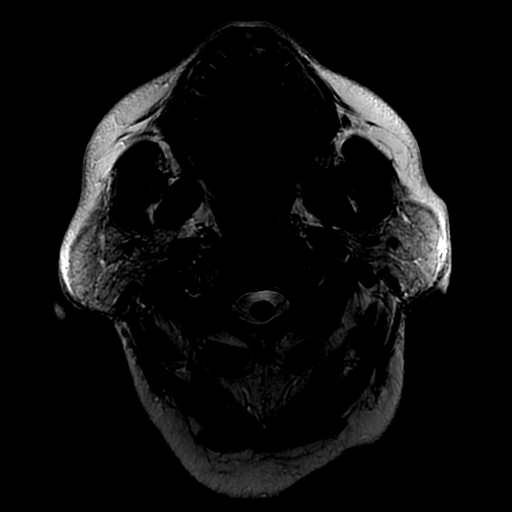

[Series 4: ax dti · axial · 3.0mm · 0.94mm/px · z∈[+10,+150]mm · 23 of 1404 slices shown]
[im 1/1404]
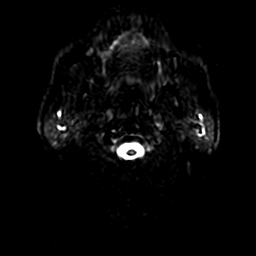
[im 64/1404]
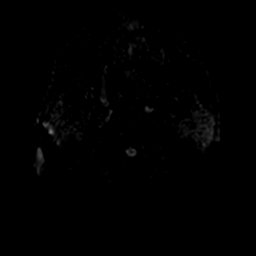
[im 128/1404]
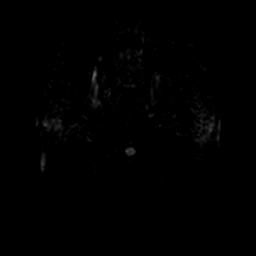
[im 192/1404]
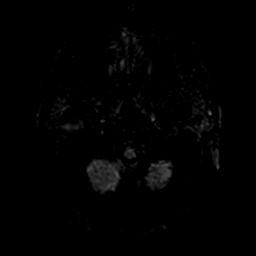
[im 256/1404]
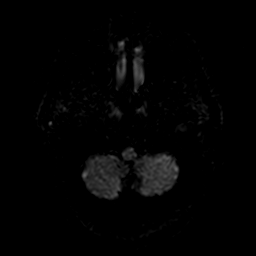
[im 319/1404]
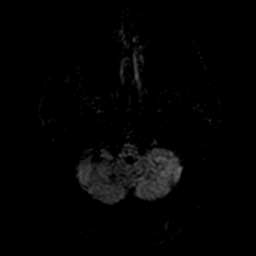
[im 383/1404]
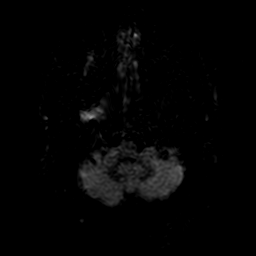
[im 447/1404]
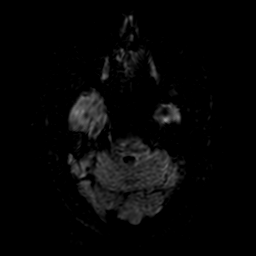
[im 511/1404]
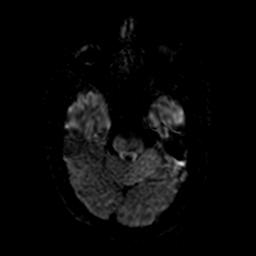
[im 574/1404]
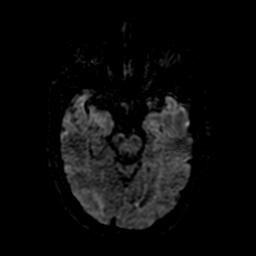
[im 638/1404]
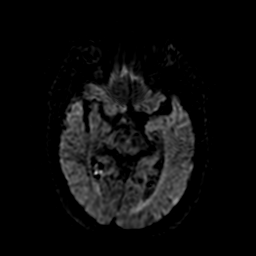
[im 702/1404]
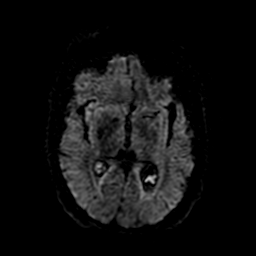
[im 766/1404]
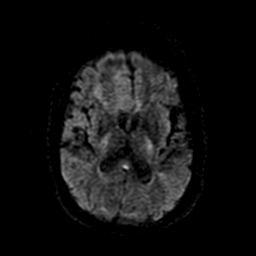
[im 830/1404]
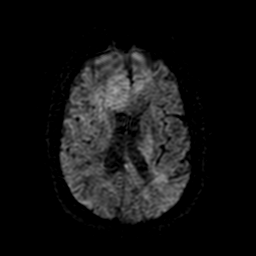
[im 893/1404]
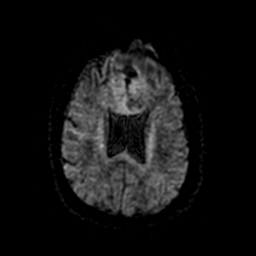
[im 957/1404]
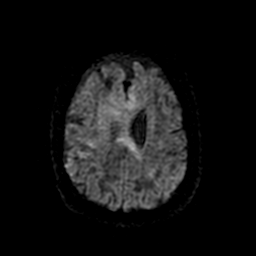
[im 1021/1404]
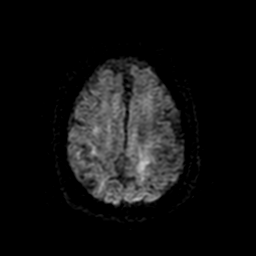
[im 1085/1404]
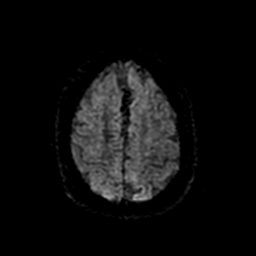
[im 1148/1404]
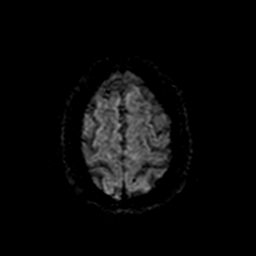
[im 1212/1404]
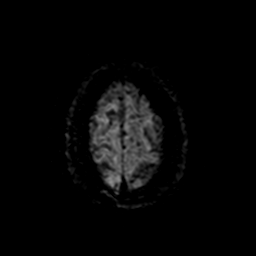
[im 1276/1404]
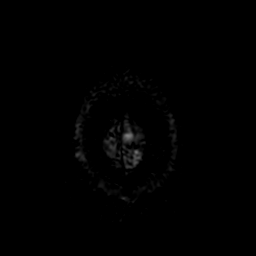
[im 1340/1404]
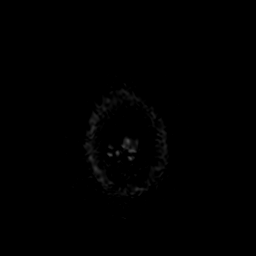
[im 1404/1404]
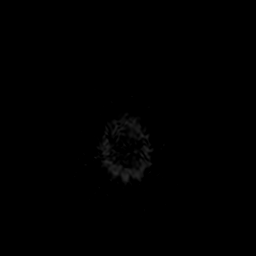

[Series 6: (person_name) · axial · 3.0mm · 0.47mm/px · z∈[+9,+151]mm · 2 of 108 slices shown]
[im 1/108]
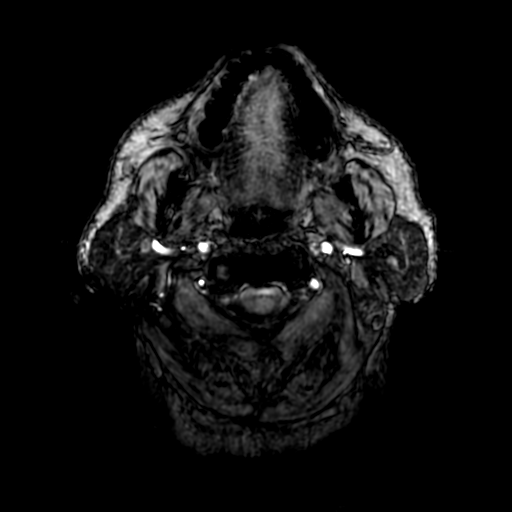
[im 108/108]
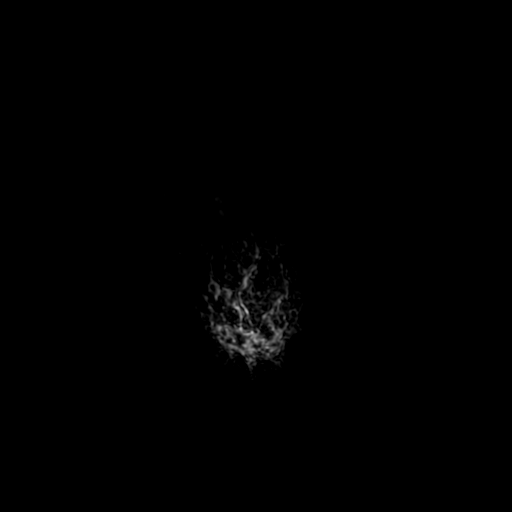

[Series 7: ax 3(person_name) · axial · 1.0mm · 1.02mm/px · z∈[-75,+124]mm · 3 of 200 slices shown (1 of 3)]
[im 1/200]
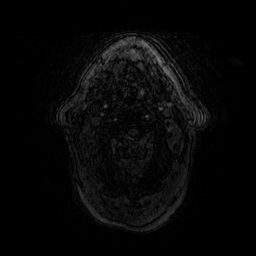
[im 100/200]
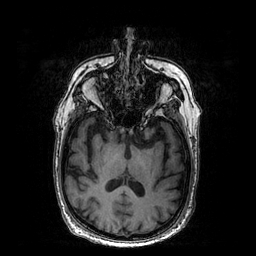
[im 200/200]
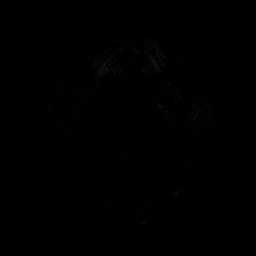

[Series 8: T2 post-contrast · coronal · 3.0mm · 0.39mm/px · 1 of 59 slices shown]
[im 1/59]
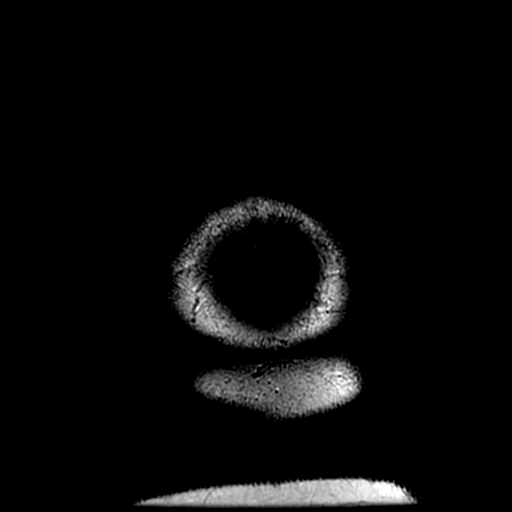

[Series 9: ax 3(person_name) · axial · 1.0mm · 1.02mm/px · z∈[-75,+124]mm · 3 of 200 slices shown (2 of 3)]
[im 1/200]
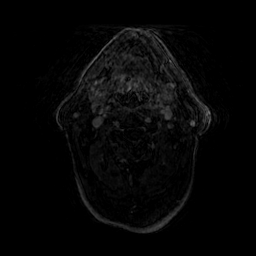
[im 100/200]
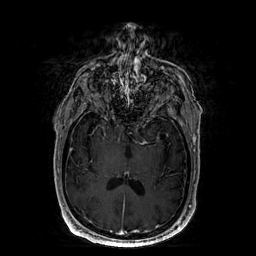
[im 200/200]
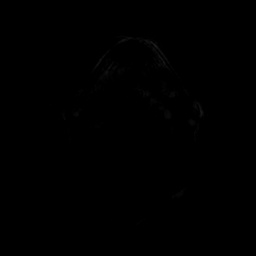

[Series 10: T1 · coronal · 5.0mm · 0.43mm/px · 1 of 30 slices shown]
[im 1/30]
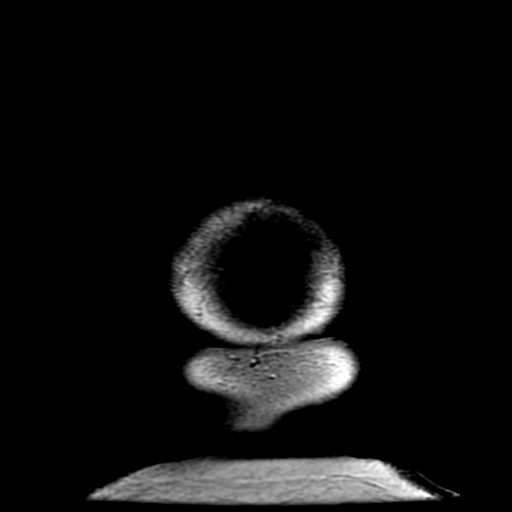

[Series 11: ax 3(person_name) · axial · 1.0mm · 1.02mm/px · z∈[-75,+124]mm · 3 of 200 slices shown (3 of 3)]
[im 1/200]
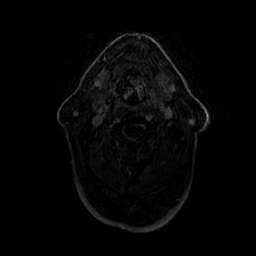
[im 100/200]
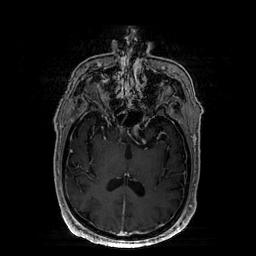
[im 200/200]
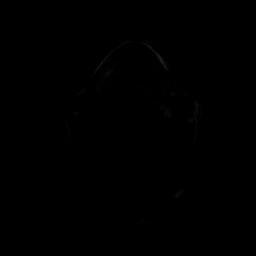

[Series 12: FLAIR · sagittal · 5.0mm · 0.47mm/px · 1 of 25 slices shown (2 of 2)]
[im 1/25]
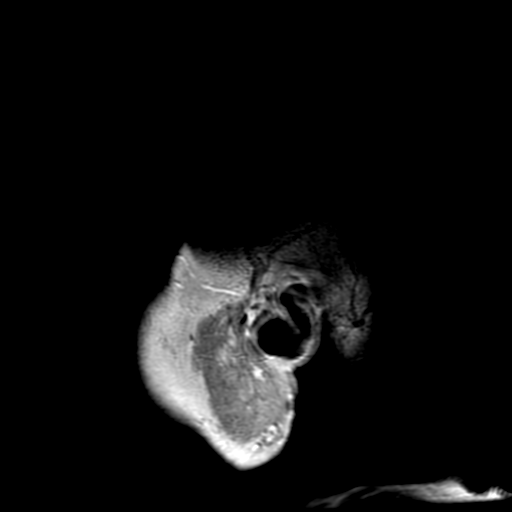

[Series 450: trace · axial · 3.0mm · 0.94mm/px · 1 of 54 slices shown]
[im 1/54]
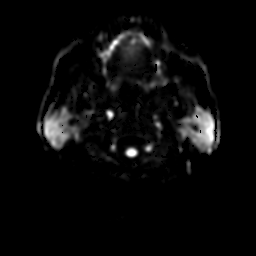

[Series 451: fa(no-q) · axial · 3.0mm · 0.94mm/px · 1 of 52 slices shown]
[im 1/52]
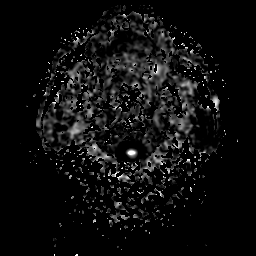

[Series 452: avdc (10^-6 mm²/s)(no-q) · axial · 3.0mm · 0.94mm/px · 1 of 54 slices shown]
[im 1/54]
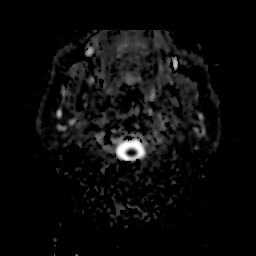

[42 of 48 positions shown; findings below may reference images not displayed]

FINDINGS: Brain: Redemonstrated heterogeneous T2 hyperintense mass in
bilateral frontal lobes, crossing expanding the genu of the corpus
callosum. The mass measures up to approximately 3.1 x 5.5 cm,
similar in size. Similar areas of cystic change and expansion.
Extensive enhancement with areas of necrosis. The degree of
enhancement appears increased in comparison to recent prior. The
mass demonstrates internal restricted diffusion, which is mild in
compatible with hypercellularity.

Similar small foci of restricted diffusion within the posterior body
of the corpus callosum and deep white matter of the right frontal
lobe.

No hydrocephalus, extra-axial fluid collection, or acute hemorrhage.

Mild patchy white matter T2 hyperintensities, nonspecific but
compatible with chronic microvascular ischemic disease.

Vascular: Major arterial flow voids are maintained at the skull
base.

Skull and upper cervical spine: Normal marrow signal.

Sinuses/Orbits: Mild paranasal sinus mucosal thickening.
Unremarkable orbits.

Other: No mastoid effusions.
IMPRESSION: 1. Similar size of a heterogeneous, centrally necrotic, and
expansile mass involving bilateral frontal lobes and crossing the
genu of the corpus callosum, concerning for high-grade glioma
(probably glioblastoma). Increased conspicuity of enhancement, which
may be due to differences in technique given short interval of
follow up although an element of tumor progression is possible.
2. Similar small foci of restricted diffusion within the posterior
body of the corpus callosum and deep white matter of the right
frontal lobe, potentially recent infarcts versus multifocal tumor.
